# Patient Record
Sex: Female | Born: 1965 | Race: Black or African American | Hispanic: No | Marital: Single | Smoking: Former smoker
Health system: Southern US, Community
[De-identification: ages and names within clinical notes are randomized; demographics above are authoritative.]

## PROBLEM LIST (undated history)

## (undated) DIAGNOSIS — I10 Essential (primary) hypertension: Secondary | ICD-10-CM

## (undated) DIAGNOSIS — E785 Hyperlipidemia, unspecified: Secondary | ICD-10-CM

## (undated) DIAGNOSIS — R569 Unspecified convulsions: Secondary | ICD-10-CM

## (undated) DIAGNOSIS — R1310 Dysphagia, unspecified: Secondary | ICD-10-CM

## (undated) HISTORY — PX: HIP SURGERY: SHX245

## (undated) HISTORY — DX: Dysphagia, unspecified: R13.10

## (undated) HISTORY — PX: NECK SURGERY: SHX720

## (undated) HISTORY — PX: LEEP: SHX91

## (undated) HISTORY — PX: FEMORAL OSTEOTOMY W/ RODDING: SHX1588

## (undated) HISTORY — PX: CHOLECYSTECTOMY: SHX55

## (undated) HISTORY — PX: BREAST BIOPSY: SHX20

---

## 2003-12-19 ENCOUNTER — Other Ambulatory Visit: Payer: Self-pay

## 2003-12-20 ENCOUNTER — Other Ambulatory Visit: Payer: Self-pay

## 2003-12-20 ENCOUNTER — Inpatient Hospital Stay: Payer: Self-pay | Admitting: Internal Medicine

## 2004-10-13 ENCOUNTER — Other Ambulatory Visit: Payer: Self-pay

## 2004-10-14 ENCOUNTER — Ambulatory Visit: Payer: Self-pay | Admitting: Surgery

## 2005-05-28 ENCOUNTER — Emergency Department: Payer: Self-pay | Admitting: Emergency Medicine

## 2005-08-25 ENCOUNTER — Emergency Department: Payer: Self-pay | Admitting: Unknown Physician Specialty

## 2005-09-06 ENCOUNTER — Ambulatory Visit: Payer: Self-pay

## 2006-01-26 ENCOUNTER — Ambulatory Visit: Payer: Self-pay | Admitting: Family Medicine

## 2006-01-28 ENCOUNTER — Ambulatory Visit: Payer: Self-pay | Admitting: Obstetrics and Gynecology

## 2009-03-20 ENCOUNTER — Emergency Department: Payer: Self-pay | Admitting: Unknown Physician Specialty

## 2009-05-15 ENCOUNTER — Emergency Department: Payer: Self-pay | Admitting: Emergency Medicine

## 2009-06-24 ENCOUNTER — Emergency Department: Payer: Self-pay | Admitting: Emergency Medicine

## 2009-07-11 ENCOUNTER — Emergency Department: Payer: Self-pay | Admitting: Emergency Medicine

## 2009-08-11 ENCOUNTER — Emergency Department: Payer: Self-pay | Admitting: Emergency Medicine

## 2009-10-08 ENCOUNTER — Emergency Department: Payer: Self-pay | Admitting: Emergency Medicine

## 2009-10-31 ENCOUNTER — Emergency Department: Payer: Self-pay | Admitting: Emergency Medicine

## 2009-11-22 ENCOUNTER — Emergency Department: Payer: Self-pay | Admitting: Emergency Medicine

## 2009-12-14 ENCOUNTER — Emergency Department: Payer: Self-pay | Admitting: Emergency Medicine

## 2009-12-18 ENCOUNTER — Emergency Department: Payer: Self-pay | Admitting: Emergency Medicine

## 2009-12-28 ENCOUNTER — Emergency Department: Payer: Self-pay | Admitting: Emergency Medicine

## 2010-01-26 ENCOUNTER — Emergency Department: Payer: Self-pay | Admitting: Emergency Medicine

## 2010-02-10 ENCOUNTER — Emergency Department: Payer: Self-pay | Admitting: Emergency Medicine

## 2010-02-23 ENCOUNTER — Emergency Department: Payer: Self-pay | Admitting: Emergency Medicine

## 2010-03-24 ENCOUNTER — Emergency Department: Payer: Self-pay | Admitting: Emergency Medicine

## 2010-03-29 ENCOUNTER — Emergency Department: Payer: Self-pay | Admitting: Emergency Medicine

## 2010-07-21 ENCOUNTER — Emergency Department: Payer: Self-pay | Admitting: Emergency Medicine

## 2010-08-06 ENCOUNTER — Emergency Department: Payer: Self-pay | Admitting: Emergency Medicine

## 2010-12-17 ENCOUNTER — Emergency Department: Payer: Self-pay | Admitting: Emergency Medicine

## 2011-01-15 ENCOUNTER — Emergency Department: Payer: Self-pay | Admitting: Emergency Medicine

## 2011-02-22 ENCOUNTER — Emergency Department: Payer: Self-pay | Admitting: Emergency Medicine

## 2011-04-10 ENCOUNTER — Emergency Department: Payer: Self-pay | Admitting: *Deleted

## 2011-04-10 LAB — COMPREHENSIVE METABOLIC PANEL
Albumin: 3.4 g/dL (ref 3.4–5.0)
BUN: 8 mg/dL (ref 7–18)
Calcium, Total: 7.9 mg/dL — ABNORMAL LOW (ref 8.5–10.1)
Chloride: 107 mmol/L (ref 98–107)
EGFR (African American): >60
Glucose: 102 mg/dL — ABNORMAL HIGH (ref 65–99)
SGOT(AST): 24 U/L (ref 15–37)
SGPT (ALT): 25 U/L
Total Protein: 6.9 g/dL (ref 6.4–8.2)

## 2011-04-10 LAB — CBC WITH DIFFERENTIAL/PLATELET
Basophil #: 0 10*3/uL (ref 0.0–0.1)
Basophil %: 0.3 %
Eosinophil #: 0.2 10*3/uL (ref 0.0–0.7)
HCT: 35.8 % (ref 35.0–47.0)
HGB: 11.9 g/dL — ABNORMAL LOW (ref 12.0–16.0)
Lymphocyte %: 22.3 %
MCH: 32.4 pg (ref 26.0–34.0)
MCHC: 33.4 g/dL (ref 32.0–36.0)
MCV: 97 fL (ref 80–100)
Monocyte #: 0.7 10*3/uL (ref 0.0–0.7)
Monocyte %: 12.3 %
Neutrophil #: 3.8 10*3/uL (ref 1.4–6.5)

## 2011-04-10 LAB — PHENYTOIN LEVEL, TOTAL: Dilantin: 4.8 ug/mL — ABNORMAL LOW (ref 10.0–20.0)

## 2011-04-15 ENCOUNTER — Emergency Department: Payer: Self-pay | Admitting: Emergency Medicine

## 2011-04-15 LAB — COMPREHENSIVE METABOLIC PANEL
Alkaline Phosphatase: 73 U/L (ref 50–136)
BUN: 6 mg/dL — ABNORMAL LOW (ref 7–18)
Co2: 22 mmol/L (ref 21–32)
Creatinine: 0.64 mg/dL (ref 0.60–1.30)
EGFR (Non-African Amer.): >60
Potassium: 3.6 mmol/L (ref 3.5–5.1)
SGOT(AST): 43 U/L — ABNORMAL HIGH (ref 15–37)

## 2011-04-15 LAB — ETHANOL: Ethanol %: <0.003 % (ref 0.000–0.080)

## 2011-04-15 LAB — CBC
MCV: 96 fL (ref 80–100)
Platelet: 179 10*3/uL (ref 150–440)
RDW: 13.4 % (ref 11.5–14.5)
WBC: 7.8 10*3/uL (ref 3.6–11.0)

## 2011-05-31 ENCOUNTER — Emergency Department: Payer: Self-pay | Admitting: Emergency Medicine

## 2011-05-31 LAB — URINALYSIS, COMPLETE
Bacteria: NONE SEEN
Bilirubin,UR: NEGATIVE
Blood: NEGATIVE
Glucose,UR: NEGATIVE mg/dL (ref 0–75)
Leukocyte Esterase: NEGATIVE
Protein: NEGATIVE
RBC,UR: NONE SEEN /HPF (ref 0–5)
WBC UR: <1 /HPF (ref 0–5)

## 2011-05-31 LAB — BASIC METABOLIC PANEL
BUN: 7 mg/dL (ref 7–18)
Calcium, Total: 8.2 mg/dL — ABNORMAL LOW (ref 8.5–10.1)
Co2: 29 mmol/L (ref 21–32)
EGFR (African American): >60
EGFR (Non-African Amer.): >60
Glucose: 98 mg/dL (ref 65–99)
Osmolality: 277 (ref 275–301)
Sodium: 140 mmol/L (ref 136–145)

## 2011-05-31 LAB — DRUG SCREEN, URINE
Benzodiazepine, Ur Scrn: NEGATIVE (ref ?–200)
Cocaine Metabolite,Ur ~~LOC~~: NEGATIVE (ref ?–300)
MDMA (Ecstasy)Ur Screen: NEGATIVE (ref ?–500)
Methadone, Ur Screen: NEGATIVE (ref ?–300)
Phencyclidine (PCP) Ur S: NEGATIVE (ref ?–25)
Tricyclic, Ur Screen: NEGATIVE (ref ?–1000)

## 2011-05-31 LAB — CBC
HCT: 36.9 % (ref 35.0–47.0)
MCH: 32.8 pg (ref 26.0–34.0)
Platelet: 171 10*3/uL (ref 150–440)
WBC: 8.6 10*3/uL (ref 3.6–11.0)

## 2011-05-31 LAB — ETHANOL: Ethanol: <3 mg/dL

## 2011-06-17 ENCOUNTER — Emergency Department: Payer: Self-pay | Admitting: Emergency Medicine

## 2011-06-17 LAB — PHENYTOIN LEVEL, TOTAL: Dilantin: 4.5 ug/mL — ABNORMAL LOW (ref 10.0–20.0)

## 2011-06-17 LAB — BASIC METABOLIC PANEL
Anion Gap: 7 (ref 7–16)
BUN: 10 mg/dL (ref 7–18)
Calcium, Total: 8.1 mg/dL — ABNORMAL LOW (ref 8.5–10.1)
Chloride: 105 mmol/L (ref 98–107)
Co2: 29 mmol/L (ref 21–32)
Creatinine: 0.62 mg/dL (ref 0.60–1.30)
EGFR (African American): >60
EGFR (Non-African Amer.): >60
Osmolality: 282 (ref 275–301)
Potassium: 3.8 mmol/L (ref 3.5–5.1)

## 2011-06-17 LAB — CBC
HCT: 35.7 % (ref 35.0–47.0)
HGB: 12 g/dL (ref 12.0–16.0)
MCH: 32.3 pg (ref 26.0–34.0)
MCV: 96 fL (ref 80–100)
RBC: 3.72 10*6/uL — ABNORMAL LOW (ref 3.80–5.20)

## 2011-06-17 LAB — ETHANOL: Ethanol %: <0.003 % (ref 0.000–0.080)

## 2011-07-11 ENCOUNTER — Emergency Department: Payer: Self-pay | Admitting: Internal Medicine

## 2011-07-11 ENCOUNTER — Emergency Department: Payer: Self-pay | Admitting: Emergency Medicine

## 2011-07-11 LAB — URINALYSIS, COMPLETE
Blood: NEGATIVE
Ketone: NEGATIVE
Nitrite: NEGATIVE
Ph: 6 (ref 4.5–8.0)
Protein: NEGATIVE
Specific Gravity: 1.005 (ref 1.003–1.030)
WBC UR: 1 /HPF (ref 0–5)

## 2011-07-11 LAB — CBC
HCT: 37.4 % (ref 35.0–47.0)
MCV: 96 fL (ref 80–100)
Platelet: 183 10*3/uL (ref 150–440)
RBC: 3.88 10*6/uL (ref 3.80–5.20)
RDW: 13.6 % (ref 11.5–14.5)
WBC: 7.9 10*3/uL (ref 3.6–11.0)

## 2011-07-11 LAB — PHENYTOIN LEVEL, TOTAL: Dilantin: 17.2 ug/mL (ref 10.0–20.0)

## 2011-07-11 LAB — COMPREHENSIVE METABOLIC PANEL
Albumin: 3.5 g/dL (ref 3.4–5.0)
Anion Gap: 6 — ABNORMAL LOW (ref 7–16)
BUN: 10 mg/dL (ref 7–18)
Bilirubin,Total: 0.2 mg/dL (ref 0.2–1.0)
Calcium, Total: 8.3 mg/dL — ABNORMAL LOW (ref 8.5–10.1)
Co2: 28 mmol/L (ref 21–32)
Creatinine: 0.84 mg/dL (ref 0.60–1.30)
EGFR (African American): >60
EGFR (Non-African Amer.): >60
Osmolality: 280 (ref 275–301)
Potassium: 3.5 mmol/L (ref 3.5–5.1)
SGOT(AST): 39 U/L — ABNORMAL HIGH (ref 15–37)
Sodium: 140 mmol/L (ref 136–145)
Total Protein: 7.3 g/dL (ref 6.4–8.2)

## 2011-07-11 LAB — DRUG SCREEN, URINE
Amphetamines, Ur Screen: NEGATIVE (ref ?–1000)
Barbiturates, Ur Screen: NEGATIVE (ref ?–200)
Cannabinoid 50 Ng, Ur ~~LOC~~: NEGATIVE (ref ?–50)
Cocaine Metabolite,Ur ~~LOC~~: NEGATIVE (ref ?–300)
Phencyclidine (PCP) Ur S: NEGATIVE (ref ?–25)

## 2011-07-11 LAB — ETHANOL
Ethanol %: <0.003 % (ref 0.000–0.080)
Ethanol: <3 mg/dL

## 2011-08-08 ENCOUNTER — Emergency Department: Payer: Self-pay | Admitting: Emergency Medicine

## 2011-08-08 LAB — BASIC METABOLIC PANEL
Anion Gap: 9 (ref 7–16)
BUN: 11 mg/dL (ref 7–18)
Calcium, Total: 8.2 mg/dL — ABNORMAL LOW (ref 8.5–10.1)
Chloride: 102 mmol/L (ref 98–107)
Co2: 30 mmol/L (ref 21–32)
EGFR (Non-African Amer.): >60
Glucose: 99 mg/dL (ref 65–99)
Osmolality: 281 (ref 275–301)
Potassium: 2.9 mmol/L — ABNORMAL LOW (ref 3.5–5.1)
Sodium: 141 mmol/L (ref 136–145)

## 2011-08-08 LAB — CBC
MCH: 32.1 pg (ref 26.0–34.0)
MCHC: 33.3 g/dL (ref 32.0–36.0)
MCV: 96 fL (ref 80–100)
Platelet: 188 10*3/uL (ref 150–440)
RBC: 3.88 10*6/uL (ref 3.80–5.20)
RDW: 12.9 % (ref 11.5–14.5)

## 2011-08-08 LAB — ETHANOL
Ethanol %: <0.003 % (ref 0.000–0.080)
Ethanol: <3 mg/dL

## 2011-08-08 LAB — PHENYTOIN LEVEL, TOTAL: Dilantin: 16.2 ug/mL (ref 10.0–20.0)

## 2011-08-29 ENCOUNTER — Emergency Department: Payer: Self-pay | Admitting: Emergency Medicine

## 2011-09-24 ENCOUNTER — Emergency Department: Payer: Self-pay | Admitting: *Deleted

## 2011-12-19 ENCOUNTER — Emergency Department: Payer: Self-pay | Admitting: Unknown Physician Specialty

## 2011-12-19 LAB — URINALYSIS, COMPLETE
Blood: NEGATIVE
Ketone: NEGATIVE
Leukocyte Esterase: NEGATIVE
Nitrite: NEGATIVE
Ph: 6 (ref 4.5–8.0)
RBC,UR: 1 /HPF (ref 0–5)
Specific Gravity: 1.008 (ref 1.003–1.030)
Squamous Epithelial: NONE SEEN

## 2011-12-19 LAB — CBC WITH DIFFERENTIAL/PLATELET
Basophil #: 0 10*3/uL (ref 0.0–0.1)
Eosinophil #: 0.3 10*3/uL (ref 0.0–0.7)
Lymphocyte #: 2.1 10*3/uL (ref 1.0–3.6)
Lymphocyte %: 21.1 %
MCHC: 34.3 g/dL (ref 32.0–36.0)
MCV: 95 fL (ref 80–100)
Monocyte %: 8.7 %
Neutrophil %: 67 %
Platelet: 184 10*3/uL (ref 150–440)
RBC: 3.99 10*6/uL (ref 3.80–5.20)
RDW: 12.8 % (ref 11.5–14.5)
WBC: 10.1 10*3/uL (ref 3.6–11.0)

## 2011-12-19 LAB — BASIC METABOLIC PANEL
Anion Gap: 9 (ref 7–16)
Calcium, Total: 7.9 mg/dL — ABNORMAL LOW (ref 8.5–10.1)
Chloride: 108 mmol/L — ABNORMAL HIGH (ref 98–107)
Co2: 23 mmol/L (ref 21–32)
EGFR (Non-African Amer.): >60
Glucose: 102 mg/dL — ABNORMAL HIGH (ref 65–99)
Osmolality: 280 (ref 275–301)
Sodium: 140 mmol/L (ref 136–145)

## 2011-12-19 LAB — PHENYTOIN LEVEL, TOTAL: Dilantin: 3.8 ug/mL — ABNORMAL LOW (ref 10.0–20.0)

## 2012-02-04 IMAGING — CR DG CHEST 1V PORT
1 series · 1 of 1 positions shown · non-contrast
Comparison: none

REASON FOR EXAM: seizure
COMMENTS:

PROCEDURE:     DXR - DXR PORTABLE CHEST SINGLE VIEW  - July 11, 2009  [DATE]
RESULT:     The lung fields are clear. The heart size is within normal
limits. There is no interstitial or pulmonary edema. The mediastinal and
osseous structures are normal in appearance.

[view not recorded]
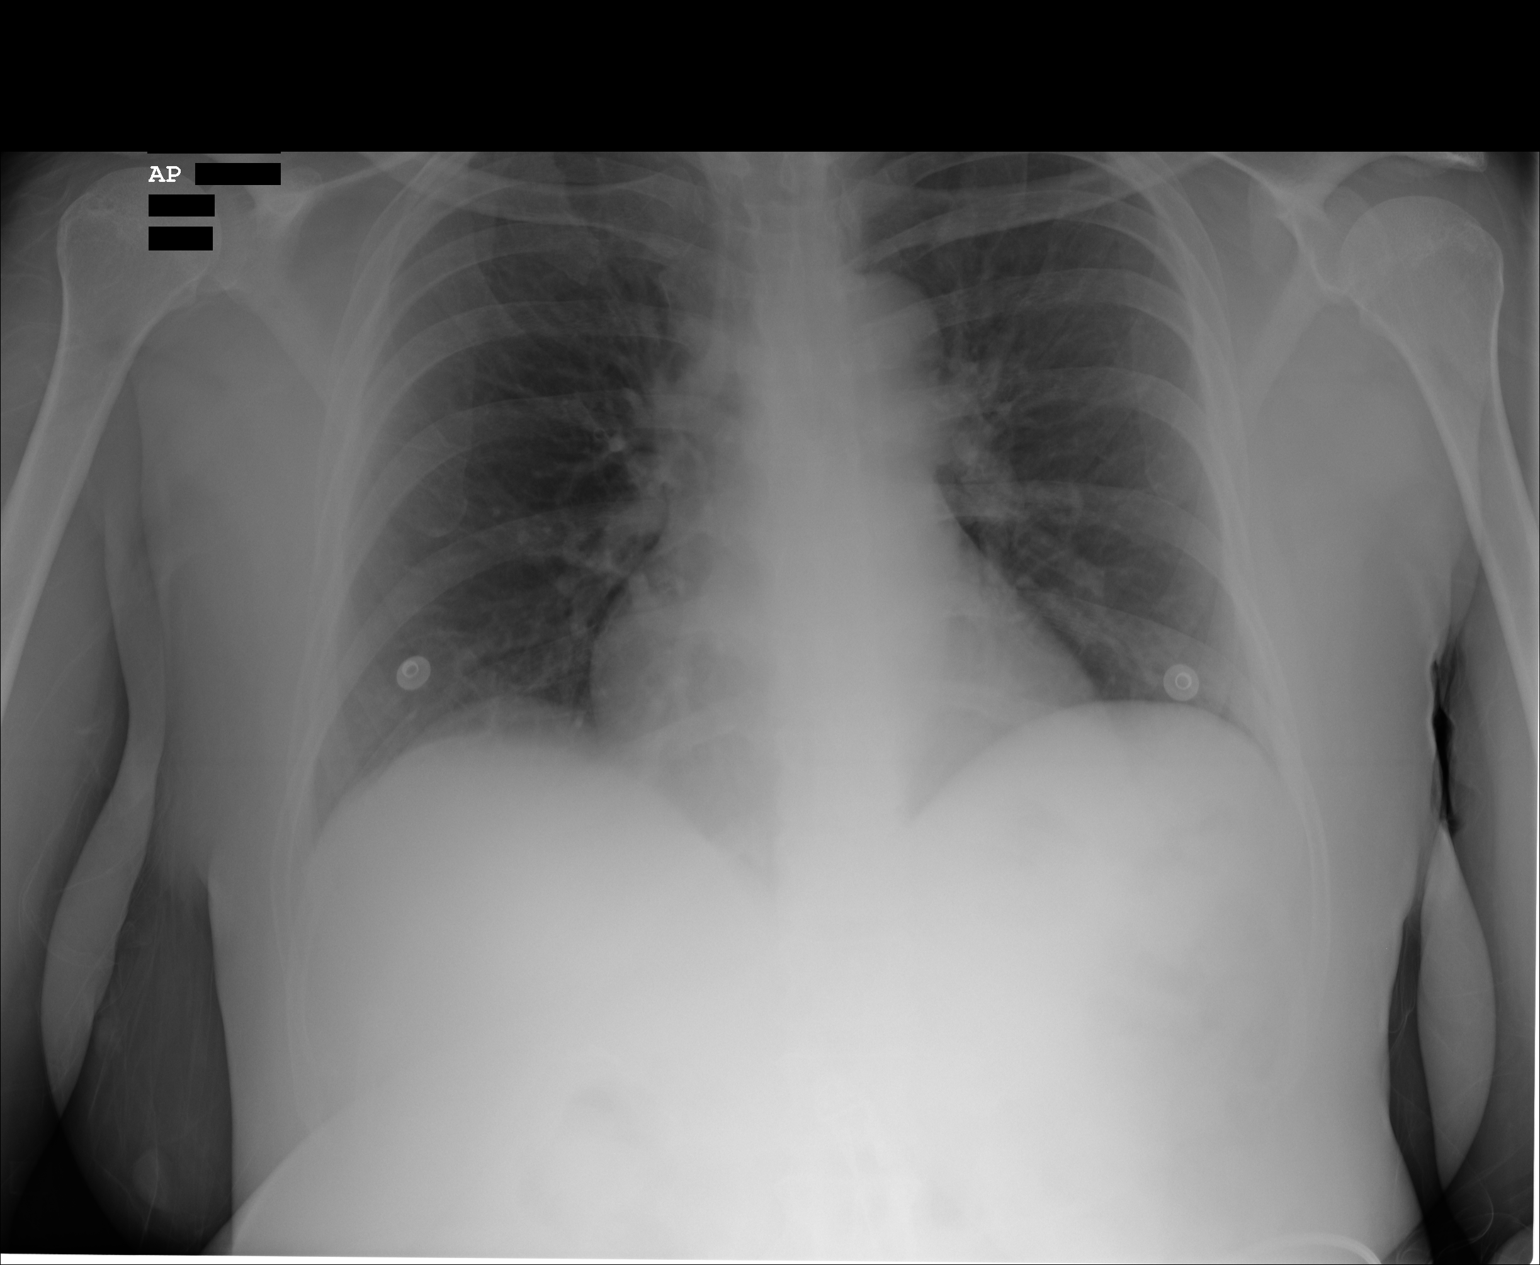

[1 of 1 positions shown; findings below may reference images not displayed]

IMPRESSION: No significant abnormalities are noted.

## 2012-11-23 ENCOUNTER — Emergency Department: Payer: Self-pay | Admitting: Emergency Medicine

## 2013-01-17 LAB — CBC
HCT: 33.7 % — ABNORMAL LOW (ref 35.0–47.0)
HGB: 11.4 g/dL — ABNORMAL LOW (ref 12.0–16.0)
MCH: 29.7 pg (ref 26.0–34.0)
MCHC: 34 g/dL (ref 32.0–36.0)
MCV: 88 fL (ref 80–100)
RDW: 13.4 % (ref 11.5–14.5)
WBC: 11 10*3/uL (ref 3.6–11.0)

## 2013-01-17 LAB — COMPREHENSIVE METABOLIC PANEL
Anion Gap: 8 (ref 7–16)
BUN: 3 mg/dL — ABNORMAL LOW (ref 7–18)
Chloride: 102 mmol/L (ref 98–107)
EGFR (African American): >60
Osmolality: 284 (ref 275–301)
SGOT(AST): 216 U/L — ABNORMAL HIGH (ref 15–37)
SGPT (ALT): 59 U/L (ref 12–78)
Sodium: 144 mmol/L (ref 136–145)
Total Protein: 7.2 g/dL (ref 6.4–8.2)

## 2013-01-17 LAB — PHENYTOIN LEVEL, TOTAL: Dilantin: 6.6 ug/mL — ABNORMAL LOW (ref 10.0–20.0)

## 2013-01-18 ENCOUNTER — Inpatient Hospital Stay: Payer: Self-pay | Admitting: Internal Medicine

## 2013-01-18 LAB — BASIC METABOLIC PANEL
Anion Gap: 7 (ref 7–16)
BUN: 4 mg/dL — ABNORMAL LOW (ref 7–18)
Calcium, Total: 5.8 mg/dL — CL (ref 8.5–10.1)
Chloride: 104 mmol/L (ref 98–107)
Co2: 32 mmol/L (ref 21–32)
Creatinine: 0.93 mg/dL (ref 0.60–1.30)
EGFR (African American): >60
EGFR (Non-African Amer.): >60
Glucose: 106 mg/dL — ABNORMAL HIGH (ref 65–99)
Osmolality: 282 (ref 275–301)
Potassium: 2.4 mmol/L — CL (ref 3.5–5.1)
Sodium: 143 mmol/L (ref 136–145)

## 2013-01-18 LAB — TSH
Thyroid Stimulating Horm: 0.203 u[IU]/mL — ABNORMAL LOW
Thyroid Stimulating Horm: 0.285 u[IU]/mL — ABNORMAL LOW

## 2013-01-18 LAB — MAGNESIUM: Magnesium: 1.4 mg/dL — ABNORMAL LOW

## 2013-01-18 LAB — T4, FREE: Free Thyroxine: 0.94 ng/dL (ref 0.76–1.46)

## 2013-01-19 LAB — BASIC METABOLIC PANEL
Anion Gap: 4 — ABNORMAL LOW (ref 7–16)
BUN: 5 mg/dL — AB (ref 7–18)
CREATININE: 0.83 mg/dL (ref 0.60–1.30)
Calcium, Total: 6.4 mg/dL — CL (ref 8.5–10.1)
Chloride: 101 mmol/L (ref 98–107)
Co2: 32 mmol/L (ref 21–32)
EGFR (Non-African Amer.): >60
Glucose: 108 mg/dL — ABNORMAL HIGH (ref 65–99)
OSMOLALITY: 272 (ref 275–301)
Potassium: 2.7 mmol/L — ABNORMAL LOW (ref 3.5–5.1)
SODIUM: 137 mmol/L (ref 136–145)

## 2013-01-19 LAB — MAGNESIUM: Magnesium: 0.9 mg/dL — ABNORMAL LOW

## 2013-01-20 LAB — BASIC METABOLIC PANEL
Anion Gap: 3 — ABNORMAL LOW (ref 7–16)
BUN: 7 mg/dL (ref 7–18)
CALCIUM: 7.6 mg/dL — AB (ref 8.5–10.1)
CHLORIDE: 100 mmol/L (ref 98–107)
Co2: 32 mmol/L (ref 21–32)
Creatinine: 0.73 mg/dL (ref 0.60–1.30)
EGFR (African American): >60
EGFR (Non-African Amer.): >60
Glucose: 104 mg/dL — ABNORMAL HIGH (ref 65–99)
Osmolality: 268 (ref 275–301)
POTASSIUM: 3.2 mmol/L — AB (ref 3.5–5.1)
Sodium: 135 mmol/L — ABNORMAL LOW (ref 136–145)

## 2013-01-20 LAB — PHOSPHORUS: Phosphorus: 2.8 mg/dL (ref 2.5–4.9)

## 2013-01-20 LAB — MAGNESIUM: Magnesium: 1.3 mg/dL — ABNORMAL LOW

## 2013-01-21 LAB — BASIC METABOLIC PANEL
ANION GAP: 1 — AB (ref 7–16)
BUN: 8 mg/dL (ref 7–18)
Calcium, Total: 7.8 mg/dL — ABNORMAL LOW (ref 8.5–10.1)
Chloride: 101 mmol/L (ref 98–107)
Co2: 32 mmol/L (ref 21–32)
Creatinine: 0.67 mg/dL (ref 0.60–1.30)
EGFR (African American): >60
Glucose: 94 mg/dL (ref 65–99)
Osmolality: 266 (ref 275–301)
POTASSIUM: 4.2 mmol/L (ref 3.5–5.1)
Sodium: 134 mmol/L — ABNORMAL LOW (ref 136–145)

## 2013-01-21 LAB — PLATELET COUNT: Platelet: 220 10*3/uL (ref 150–440)

## 2013-01-21 LAB — MAGNESIUM: MAGNESIUM: 1.5 mg/dL — AB

## 2013-12-20 ENCOUNTER — Emergency Department: Payer: Self-pay | Admitting: Emergency Medicine

## 2013-12-20 LAB — CBC WITH DIFFERENTIAL/PLATELET
Basophil #: 0 10*3/uL (ref 0.0–0.1)
Basophil %: 0.5 %
EOS PCT: 4 %
Eosinophil #: 0.4 10*3/uL (ref 0.0–0.7)
HCT: 35.6 % (ref 35.0–47.0)
HGB: 11.6 g/dL — ABNORMAL LOW (ref 12.0–16.0)
Lymphocyte #: 1.7 10*3/uL (ref 1.0–3.6)
Lymphocyte %: 17.5 %
MCH: 31.4 pg (ref 26.0–34.0)
MCHC: 32.5 g/dL (ref 32.0–36.0)
MCV: 97 fL (ref 80–100)
Monocyte #: 0.6 x10 3/mm (ref 0.2–0.9)
Monocyte %: 6.4 %
Neutrophil #: 7.1 10*3/uL — ABNORMAL HIGH (ref 1.4–6.5)
Neutrophil %: 71.6 %
Platelet: 202 10*3/uL (ref 150–440)
RBC: 3.68 10*6/uL — ABNORMAL LOW (ref 3.80–5.20)
RDW: 14.2 % (ref 11.5–14.5)
WBC: 9.9 10*3/uL (ref 3.6–11.0)

## 2013-12-20 LAB — BASIC METABOLIC PANEL
Anion Gap: 10 (ref 7–16)
BUN: 6 mg/dL — ABNORMAL LOW (ref 7–18)
CHLORIDE: 109 mmol/L — AB (ref 98–107)
CREATININE: 0.83 mg/dL (ref 0.60–1.30)
Calcium, Total: 6.8 mg/dL — CL (ref 8.5–10.1)
Co2: 25 mmol/L (ref 21–32)
EGFR (African American): >60
Glucose: 95 mg/dL (ref 65–99)
OSMOLALITY: 284 (ref 275–301)
POTASSIUM: 2.5 mmol/L — AB (ref 3.5–5.1)
Sodium: 144 mmol/L (ref 136–145)

## 2013-12-20 LAB — PHENYTOIN LEVEL, TOTAL: Dilantin: 5 ug/mL — ABNORMAL LOW (ref 10.0–20.0)

## 2014-02-03 IMAGING — CT CT HEAD WITHOUT CONTRAST
2 series · 16 of 30 positions shown, 20 images · non-contrast
Comparison: none

REASON FOR EXAM: ha seizure
COMMENTS:

[Series 2: without · axial · non-contrast · 0.41mm/px · z∈[-156,-31]mm · 13 of 31 slices shown, 17 images]
[im 3/31  brain]
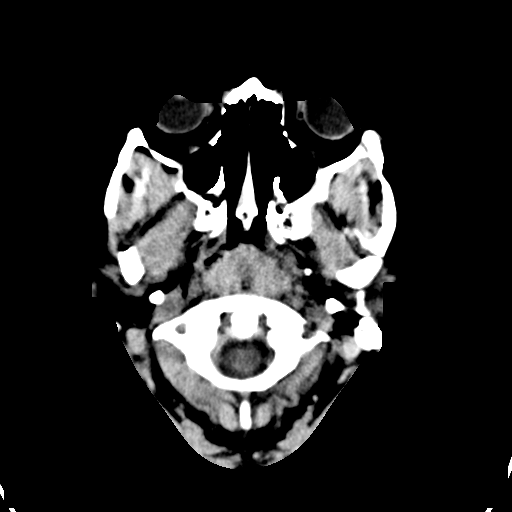
[im 3/31  bone]
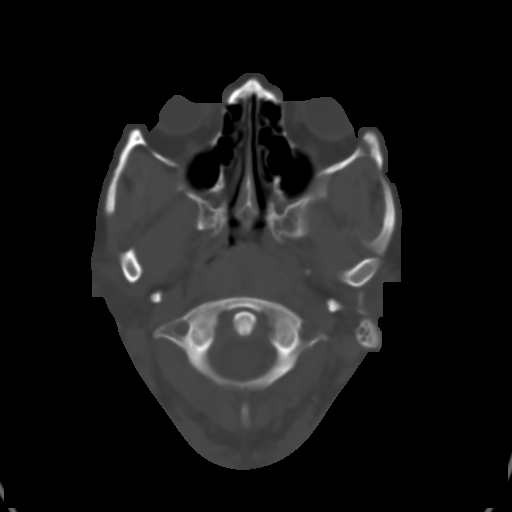
[im 5/31  brain]
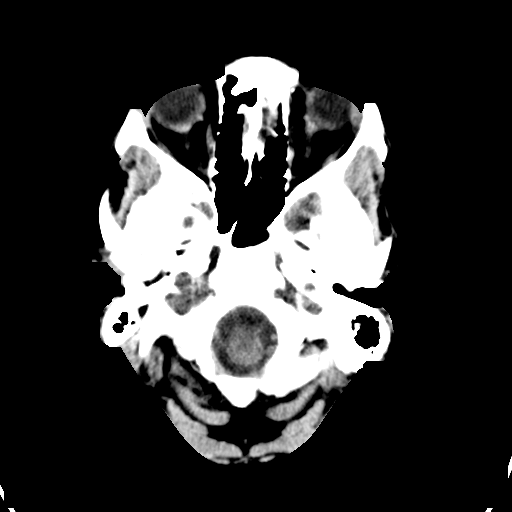
[im 7/31  brain]
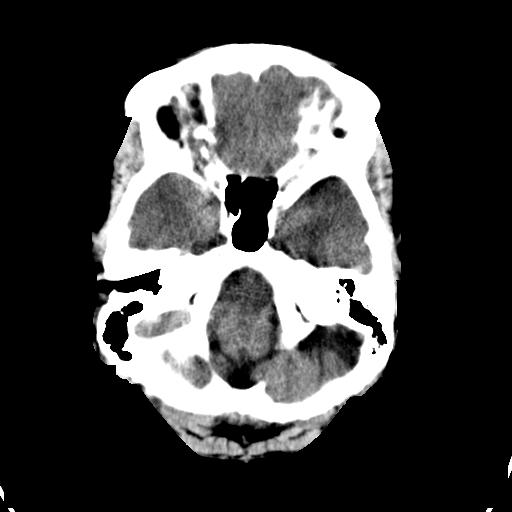
[im 9/31  brain]
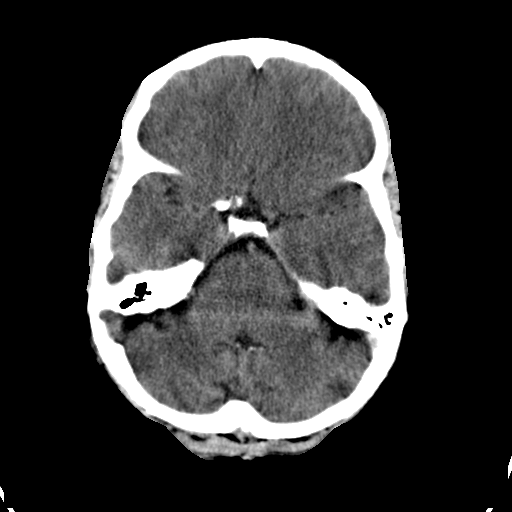
[im 11/31  brain]
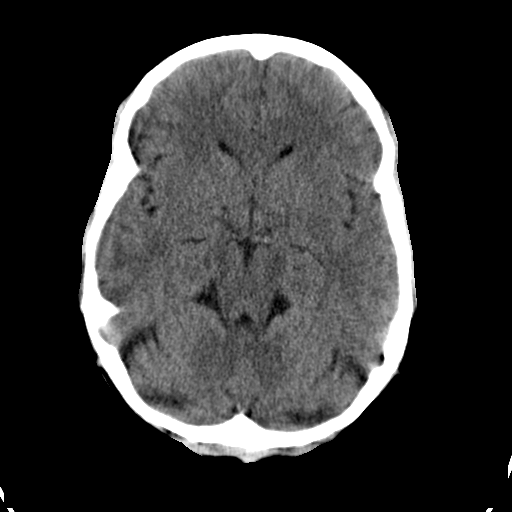
[im 11/31  bone]
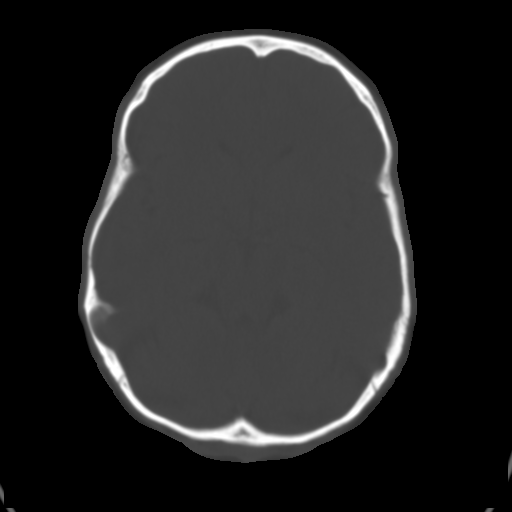
[im 13/31  brain]
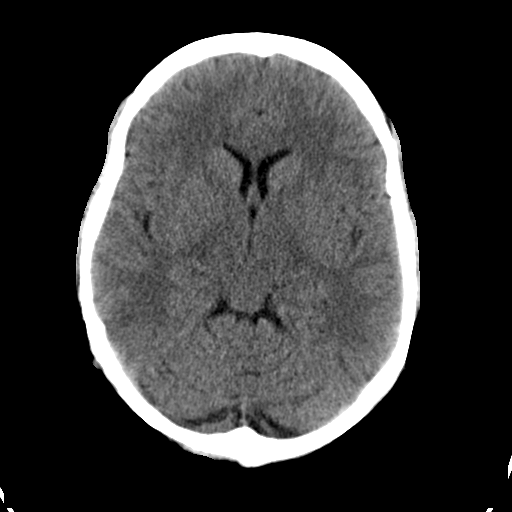
[im 16/31  brain]
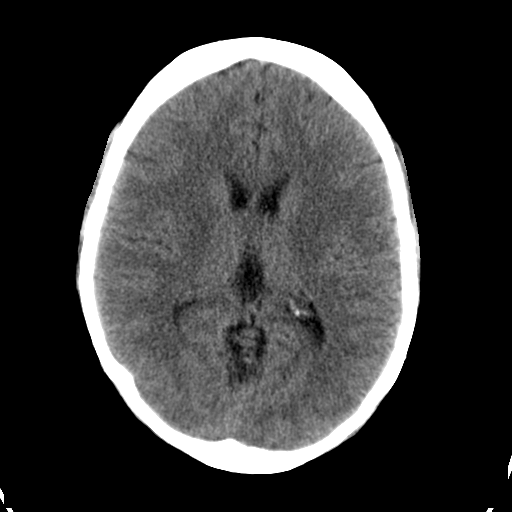
[im 18/31  brain]
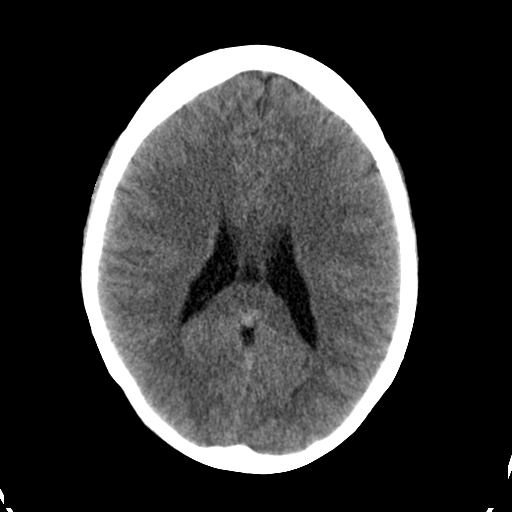
[im 20/31  brain]
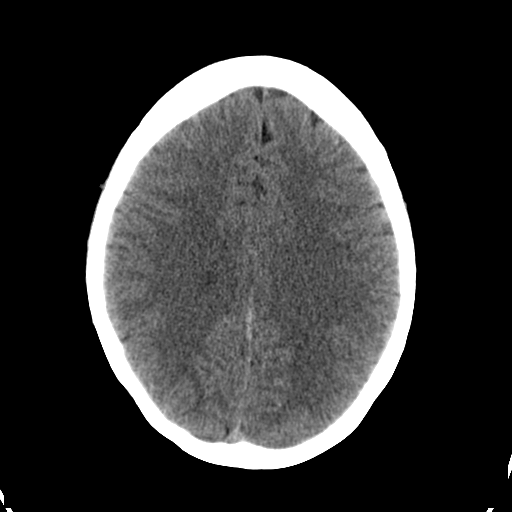
[im 20/31  bone]
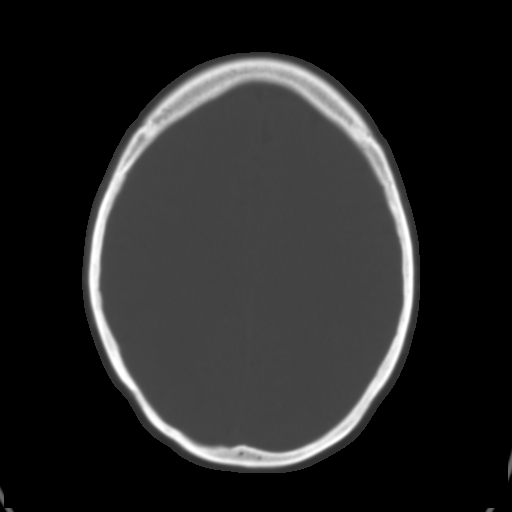
[im 22/31  brain]
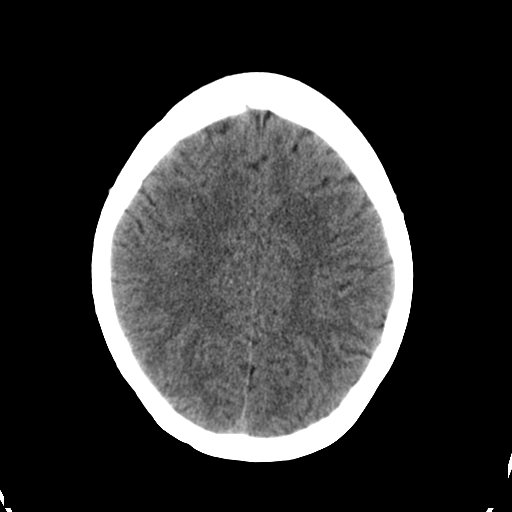
[im 24/31  brain]
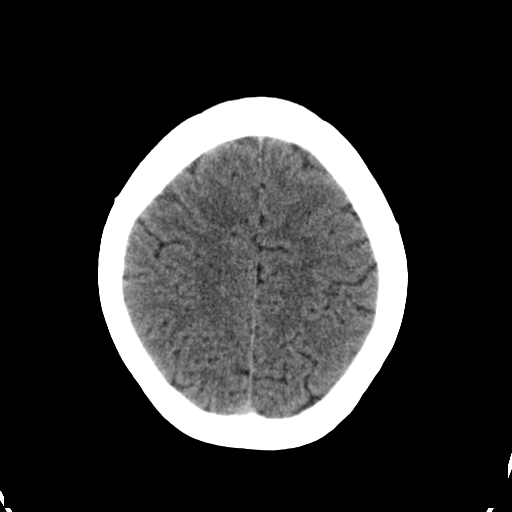
[im 26/31  brain]
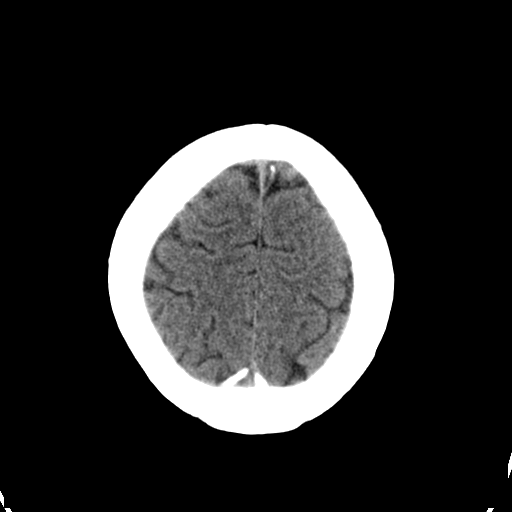
[im 28/31  brain]
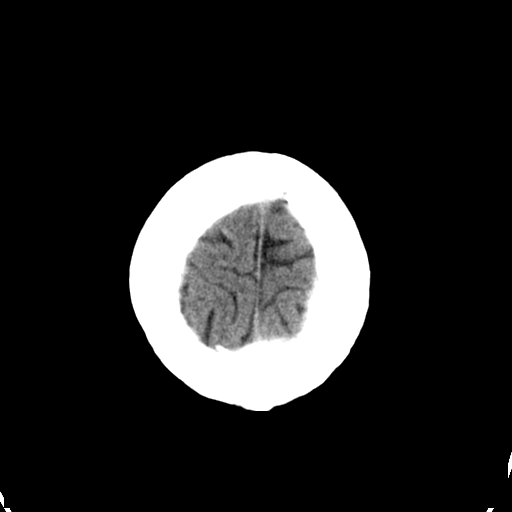
[im 28/31  bone]
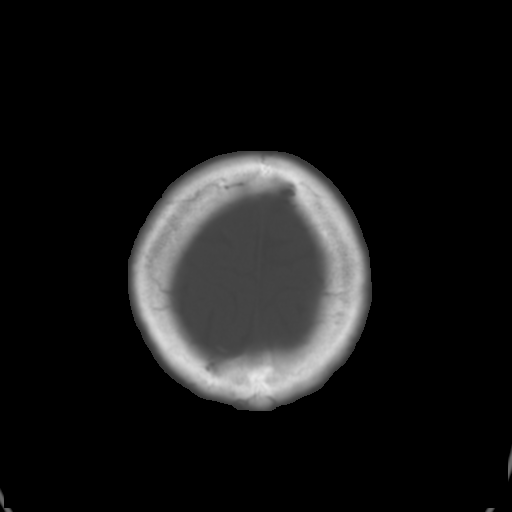

[Series 3: bone · axial · 0.41mm/px · z∈[-156,-116]mm · 3 of 31 slices shown]
[im 3/31  bone]
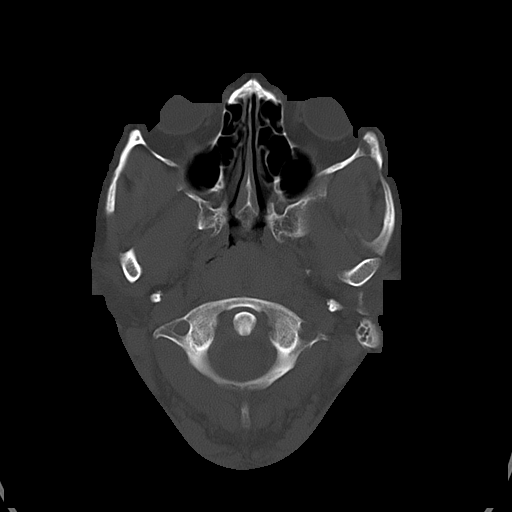
[im 7/31  bone]
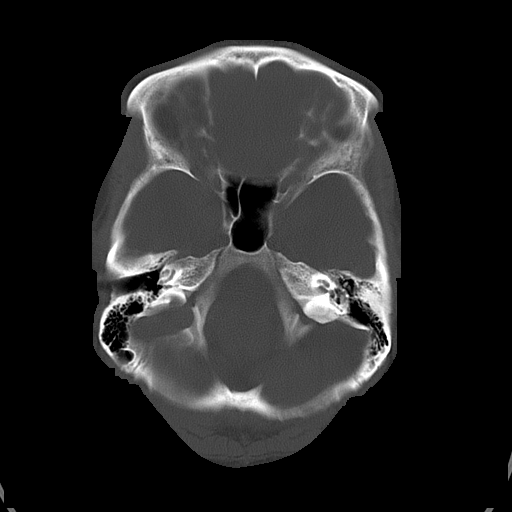
[im 11/31  bone]
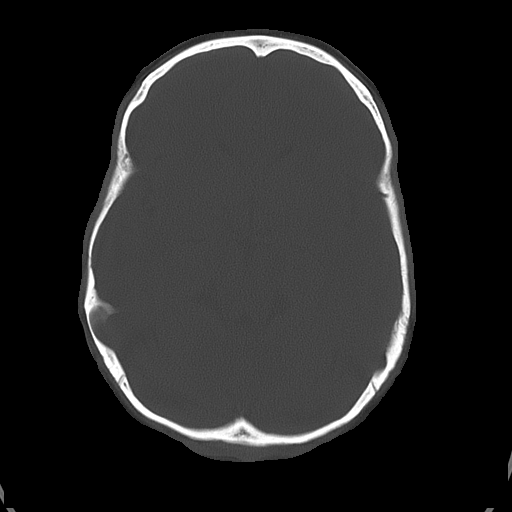

[16 of 30 positions shown; findings below may reference images not displayed]

PROCEDURE:     CT  - CT HEAD WITHOUT CONTRAST  - July 11, 2011 [DATE]

RESULT:     The ventricles and sulci are normal. There is no hemorrhage.
There is no focal mass, mass-effect or midline shift. There is no evidence
of edema or territorial infarct. The bone windows demonstrate normal
aeration of the paranasal sinuses and mastoid air cells. There is no skull
fracture demonstrated. There is no change since the study [DATE]. No acute intracranial abnormality.

[REDACTED]

## 2014-05-11 NOTE — H&P (Signed)
PATIENT NAME:  Sherry Boyd, LAFOREST MR#:  [id number] DATE OF BIRTH:  May 19, 1965  DATE OF ADMISSION:  01/17/2013  REFERRING PHYSICIAN:  Dr. Shaune Pollack.   PRIMARY CARE PHYSICIAN:  [facility].   CHIEF COMPLAINT:  Bilateral hand and foot cramping.   A 49 year old [place] American female with past medical history of hypertension and seizure disorder presenting with hand and foot cramping. She describes a 2 to 3-day duration of intermittent hand and foot cramping and tingling sensation, which is bilateral in nature. No worsening or relieving factors. She also denotes a 2 to 3-week duration of diarrhea with associated decreased p.o. intake, describing this as 3 to 4 bowel movements daily which were watery. She also mentions having recent antibiotic usage approximately 1 month ago; however, denies any current fevers or chills. Currently, she is without complaints.     On routine labs performed by the Emergency Department, she was found to be markedly hypocalcemic, hypokalemic, and hypomagnesemic.   REVIEW OF SYSTEMS:  CONSTITUTIONAL:  Denies fever. However, positive for generalized fatigue and weakness.  EYES:  Denies blurred vision, double vision, eye pain.  EARS, NOSE, THROAT:  Denies tinnitus, ear pain, hearing loss.  RESPIRATORY:  Denies cough, wheeze, shortness of breath.  CARDIOVASCULAR:  Denies chest pain, palpitations, edema.  GASTROINTESTINAL:  Denies nausea, vomiting, diarrhea.  GENITOURINARY:  Denies dysuria, hematuria.  ENDOCRINE:  Denies nocturia or thyroid problems.  HEMATOLOGIC AND LYMPHATIC:  Denies easy bruising or bleeding.  SKIN:  Denies rash or lesions.  MUSCULOSKELETAL:  Denies pain in neck, back, shoulder, knees, hips; or arthritic symptoms.  NEUROLOGICAL:  Positive for paresthesias as described above, bilateral hands and feet. However, denies any paralysis or numbness.  PSYCHIATRIC:  Denies any anxiety or depressive symptoms.  Otherwise, full review of systems performed by me  is negative.   PAST MEDICAL HISTORY:  Hypertension; seizure disorder, last seizure many years ago; anxiety and depression, not otherwise specified.   SOCIAL HISTORY:  Everyday tobacco use, less than 1 pack daily. Denies any alcohol usage or drug usage.   FAMILY HISTORY:  Positive for hypertension and diabetes.   ALLERGIES:  No known drug allergies.   HOME MEDICATIONS:  Include Norvasc 5 mg p.o. daily, magnesium 64 two tablets p.o. b.i.d., phenytoin 100 mg extended release 3 capsules daily at bedtime.   PHYSICAL EXAMINATION:  VITAL SIGNS:  Temperature 98.3, heart rate 76, respirations 18, blood pressure 123/80, saturating 95% on room air. Weight 90.7 kg, BMI 37.8.  GENERAL:  Well-nourished, well-developed, African American female, currently in no acute distress.  HEAD:  Normocephalic, atraumatic.  EYES:  Pupils equal, round, and reactive to light. Extraocular muscles intact. No scleral icterus.  MOUTH:  Dry mucosal membranes. Dentition intact. No abscess noted.  EARS, NOSE, THROAT:  Throat clear without exudates. No external lesions.  NECK:  Supple. No thyromegaly. No nodules. No JVD.  PULMONARY:  Clear to auscultation bilaterally. No wheezes, rales, or rhonchi. No use of accessory muscles. Good respiratory effort.  CHEST:  Nontender to palpation.  CARDIOVASCULAR:  S1, S2. Regular rate and rhythm. No murmurs, rubs, or gallops. No edema. Pedal pulses 2+ bilaterally.  GASTROINTESTINAL:  Soft, nontender, nondistended. No masses. Positive bowel sounds. No hepatosplenomegaly.  MUSCULOSKELETAL:  No swelling, clubbing, or edema. Range of motion full in all extremities.  NEUROLOGICAL:  Cranial nerves II through XII intact. No gross focal neurological deficits. Sensation intact. Reflexes intact.  SKIN:  No ulcerations, lesions, rash, or cyanosis. Skin is warm and dry. Skin turgor  poor.  PSYCHIATRIC:  Mood and affect within normal limits. Awake, alert, and oriented x3. Insight and judgment intact.    LABORATORY DATA:  Sodium 144, potassium 2, chloride 102, bicarb 34, BUN 3, creatinine 0.98, glucose 101, calcium 6.2, magnesium 0.7. LFTs: Albumin 3.1, AST of 216, otherwise within normal limits. Dilantin of 6.6. WBC is 11, hemoglobin 11.4, platelets 238. EKG performed revealing normal sinus rhythm with heart rate of 72.   ASSESSMENT AND PLAN:  A 49 year old [place] female with history of hypertension and seizure disorder presenting with bilateral hand cramping, found to have markedly abnormal labs.  1.  Diarrhea. Given history of recent antibiotic usage, we will check Clostridium difficile, as well as fecal WBCs. If both of these are normal, she can start Imodium for symptomatic relief. Otherwise, she will require treatment for Clostridium difficile if positive.  2.  Hypocalcemia. We will check a vitamin D level, as well as parathyroid hormone level, and replace calcium.  3.  Hypokalemia. Replace potassium with a goal of 4 to 5. This is likely secondary to gastrointestinal losses.  4.  Hypomagnesemia. Replace to goal of 2. Once again, likely related to gastrointestinal losses.  5.  Seizure disorder. Continue with Dilantin.  6.  Venous thromboembolism prophylaxis with heparin subcutaneous.   The patient is FULL CODE.   TIME SPENT: 45 minutes.    ____________________________ Cletis Athensavid K. Omah Dewalt, MD dkh:ms D: 01/17/2013 22:47:02 ET T: 01/17/2013 23:16:29 ET JOB#: [id number]  cc: Cletis Athensavid K. Jevonte Clanton, MD, <Dictator> Tahara Ruffini Synetta ShadowK Neev Mcmains MD ELECTRONICALLY SIGNED 01/18/2013 2:29

## 2014-05-11 NOTE — Discharge Summary (Signed)
PATIENT NAME:  Sherry Boyd, Sherry Boyd MR#:  [id number] DATE OF BIRTH:  1965-11-20  DATE OF ADMISSION:  01/18/2013 DATE OF DISCHARGE:  01/21/2013  PRIMARY CARE PHYSICIAN:  Dr.  [facility].      DISCHARGE DIAGNOSES: 1.  Diarrhea.  2.  Hypokalemia.  3.  Hypomagnesemia.  4.  Hypocalcemia.  5.  Hypertension.  6.  Seizure disorder.   CONDITION: Stable.   CODE STATUS: Full code.   HOME MEDICATIONS:  1.  Phenytoin  100 mg p.o. three caps at bedtime.  2.  Norvasc 5 mg p.o. daily.  3.  Mag-64 oral delayed release tablets 2 tablets twice a day.  4.  Calcium vitamin D 500 mg/200 international units 1 tablet twice a day.   DIET: Low sodium diet.   ACTIVITY: As tolerated.   FOLLOW-UP CARE: Follow with PCP within 1 to 2 weeks.   REASON FOR ADMISSION: Bilateral hand and foot cramping.   HOSPITAL COURSE: The patient is a 49 year old African American female with a history of hypertension, seizure disorder, presented to the ED with a hand and foot cramping for 2 to 3 days. The patient was found to markedly hypocalcemic, hypokalemic and hypomagnesemic.  For detailed history and physical examination, please refer to the admission note dictated by Dr. Clint GuyHower.   Laboratory data on admission date showed sodium 144, potassium 2, chloride 102, bicarbonate 34, BUN 3, creatinine 0.98. Glucose 101, magnesium 0.7, calcium is 6.2, albumin 3.1. Dilantin level 6.6.  1.  Diarrhea after admission, the patient has no bowel movement. We could not get a sample for Clostridium difficile test. In addition, the patient feels constipated. No bowel movement for 3 to 4 days. The patient got milk of magnesia and Colace today and had a bowel movement without  diarrhea.  2.  For hypocalcemia, the patient was placed with calcium and vitamin D tablets.  3.  For hypokalemia. The patient has been treated with potassium both IV and p.o. potassium level increased to 4.2 today.  4.  Hypomagnesemia. The patient continues  hypomagnesemia and after supplement the patient magnesium level increased to 1.5 today. The patient got another dose of IV magnesium and we will continue the patient's oral magnesium medication.  5.  Seizure disorder. No seizure activity,  continue Dilantin. The patient has no complaints today. Vital signs are stable. The patient is clinically stable and will be discharged to home today. I discussed the patient's discharge plan with the patient, nurse.   TIME SPENT: About 35 minutes.   ____________________________ Shaune PollackQing Jouri Threat, MD qc:cc D: 01/21/2013 15:44:59 ET T: 01/21/2013 21:10:57 ET JOB#: [id number]  cc: Shaune PollackQing Evan Mackie, MD, <Dictator> Shaune PollackQING Lauris Serviss MD ELECTRONICALLY SIGNED 01/22/2013 16:54

## 2014-07-01 ENCOUNTER — Emergency Department: Payer: Medicare Other

## 2014-07-01 ENCOUNTER — Encounter: Payer: Self-pay | Admitting: *Deleted

## 2014-07-01 ENCOUNTER — Inpatient Hospital Stay
Admission: EM | Admit: 2014-07-01 | Discharge: 2014-07-03 | DRG: 101 | Disposition: A | Discharge status: Home / Self Care | Payer: Medicare Other | Attending: Internal Medicine | Admitting: Internal Medicine

## 2014-07-01 DIAGNOSIS — Z87891 Personal history of nicotine dependence: Secondary | ICD-10-CM

## 2014-07-01 DIAGNOSIS — Z9049 Acquired absence of other specified parts of digestive tract: Secondary | ICD-10-CM | POA: Diagnosis not present

## 2014-07-01 DIAGNOSIS — E876 Hypokalemia: Secondary | ICD-10-CM | POA: Diagnosis not present

## 2014-07-01 DIAGNOSIS — E785 Hyperlipidemia, unspecified: Secondary | ICD-10-CM | POA: Diagnosis present

## 2014-07-01 DIAGNOSIS — Z79899 Other long term (current) drug therapy: Secondary | ICD-10-CM | POA: Diagnosis not present

## 2014-07-01 DIAGNOSIS — S01511A Laceration without foreign body of lip, initial encounter: Secondary | ICD-10-CM | POA: Diagnosis present

## 2014-07-01 DIAGNOSIS — G319 Degenerative disease of nervous system, unspecified: Secondary | ICD-10-CM | POA: Diagnosis not present

## 2014-07-01 DIAGNOSIS — T50995A Adverse effect of other drugs, medicaments and biological substances, initial encounter: Secondary | ICD-10-CM | POA: Diagnosis present

## 2014-07-01 DIAGNOSIS — I10 Essential (primary) hypertension: Secondary | ICD-10-CM | POA: Diagnosis present

## 2014-07-01 DIAGNOSIS — Z9114 Patient's other noncompliance with medication regimen: Secondary | ICD-10-CM | POA: Diagnosis not present

## 2014-07-01 DIAGNOSIS — R569 Unspecified convulsions: Secondary | ICD-10-CM

## 2014-07-01 DIAGNOSIS — F329 Major depressive disorder, single episode, unspecified: Secondary | ICD-10-CM | POA: Diagnosis not present

## 2014-07-01 DIAGNOSIS — G4089 Other seizures: Principal | ICD-10-CM | POA: Diagnosis present

## 2014-07-01 DIAGNOSIS — IMO0002 Reserved for concepts with insufficient information to code with codable children: Secondary | ICD-10-CM

## 2014-07-01 HISTORY — DX: Unspecified convulsions: R56.9

## 2014-07-01 HISTORY — DX: Essential (primary) hypertension: I10

## 2014-07-01 HISTORY — DX: Hyperlipidemia, unspecified: E78.5

## 2014-07-01 LAB — URINE DRUG SCREEN, QUALITATIVE (ARMC ONLY)
Amphetamines, Ur Screen: NOT DETECTED
BARBITURATES, UR SCREEN: NOT DETECTED
BENZODIAZEPINE, UR SCRN: NOT DETECTED
COCAINE METABOLITE, UR ~~LOC~~: NOT DETECTED
Cannabinoid 50 Ng, Ur ~~LOC~~: NOT DETECTED
MDMA (Ecstasy)Ur Screen: NOT DETECTED
METHADONE SCREEN, URINE: NOT DETECTED
OPIATE, UR SCREEN: NOT DETECTED
Phencyclidine (PCP) Ur S: NOT DETECTED
TRICYCLIC, UR SCREEN: NOT DETECTED

## 2014-07-01 LAB — CBC
HCT: 37.5 % (ref 35.0–47.0)
Hemoglobin: 12.5 g/dL (ref 12.0–16.0)
MCH: 32.2 pg (ref 26.0–34.0)
MCHC: 33.4 g/dL (ref 32.0–36.0)
MCV: 96.3 fL (ref 80.0–100.0)
PLATELETS: 153 10*3/uL (ref 150–440)
RBC: 3.89 MIL/uL (ref 3.80–5.20)
RDW: 13 % (ref 11.5–14.5)
WBC: 10.2 10*3/uL (ref 3.6–11.0)

## 2014-07-01 LAB — BASIC METABOLIC PANEL
Anion gap: 8 (ref 5–15)
BUN: 12 mg/dL (ref 6–20)
CHLORIDE: 105 mmol/L (ref 101–111)
CO2: 25 mmol/L (ref 22–32)
Calcium: 8 mg/dL — ABNORMAL LOW (ref 8.9–10.3)
Creatinine, Ser: 0.89 mg/dL (ref 0.44–1.00)
GFR calc Af Amer: >60 mL/min (ref >60)
GFR calc non Af Amer: >60 mL/min (ref >60)
GLUCOSE: 96 mg/dL (ref 65–99)
Potassium: 2.6 mmol/L — CL (ref 3.5–5.1)
SODIUM: 138 mmol/L (ref 135–145)

## 2014-07-01 LAB — URINALYSIS COMPLETE WITH MICROSCOPIC (ARMC ONLY)
Bacteria, UA: NONE SEEN
Bilirubin Urine: NEGATIVE
Glucose, UA: NEGATIVE mg/dL
Ketones, ur: NEGATIVE mg/dL
Leukocytes, UA: NEGATIVE
Nitrite: NEGATIVE
PROTEIN: 30 mg/dL — AB
Specific Gravity, Urine: 1.006 (ref 1.005–1.030)
pH: 7 (ref 5.0–8.0)

## 2014-07-01 LAB — GLUCOSE, CAPILLARY: GLUCOSE-CAPILLARY: 92 mg/dL (ref 65–99)

## 2014-07-01 LAB — MAGNESIUM: Magnesium: 1.5 mg/dL — ABNORMAL LOW (ref 1.7–2.4)

## 2014-07-01 LAB — POCT PREGNANCY, URINE: PREG TEST UR: NEGATIVE

## 2014-07-01 LAB — PHENYTOIN LEVEL, TOTAL: Phenytoin Lvl: <2.5 ug/mL — ABNORMAL LOW (ref 10.0–20.0)

## 2014-07-01 MED ORDER — FLUOXETINE HCL 10 MG PO CAPS
10.0000 mg | ORAL_CAPSULE | Freq: Every day | ORAL | Status: DC
  Administered 2014-07-02 – 2014-07-03 (×2): 10 mg via ORAL
  Filled 2014-07-01 (×3): qty 1

## 2014-07-01 MED ORDER — POTASSIUM CHLORIDE 10 MEQ/100ML IV SOLN
10.0000 meq | INTRAVENOUS | Status: AC
  Administered 2014-07-01 (×4): 10 meq via INTRAVENOUS
  Filled 2014-07-01 (×7): qty 100

## 2014-07-01 MED ORDER — ALBUTEROL SULFATE (2.5 MG/3ML) 0.083% IN NEBU: 2.5000 mg | INHALATION_SOLUTION | RESPIRATORY_TRACT | Status: DC | PRN

## 2014-07-01 MED ORDER — POTASSIUM CHLORIDE 10 MEQ/100ML IV SOLN
10.0000 meq | INTRAVENOUS | Status: AC
  Administered 2014-07-01 – 2014-07-02 (×4): 10 meq via INTRAVENOUS
  Filled 2014-07-01 (×4): qty 100

## 2014-07-01 MED ORDER — TETANUS-DIPHTHERIA TOXOIDS TD 5-2 LFU IM INJ
0.5000 mL | INJECTION | Freq: Once | INTRAMUSCULAR | Status: DC
Start: 2014-07-01 — End: 2014-07-01

## 2014-07-01 MED ORDER — SODIUM CHLORIDE 0.9 % IV SOLN
Freq: Once | INTRAVENOUS | Status: AC
  Administered 2014-07-01: 15:00:00 via INTRAVENOUS

## 2014-07-01 MED ORDER — LIDOCAINE-EPINEPHRINE (PF) 2 %-1:200000 IJ SOLN
10.0000 mL | Freq: Once | INTRAMUSCULAR | Status: AC
  Administered 2014-07-01: 10 mL via INTRADERMAL

## 2014-07-01 MED ORDER — AMLODIPINE BESYLATE 5 MG PO TABS
5.0000 mg | ORAL_TABLET | Freq: Every day | ORAL | Status: DC
  Administered 2014-07-02 – 2014-07-03 (×2): 5 mg via ORAL
  Filled 2014-07-01 (×3): qty 1

## 2014-07-01 MED ORDER — POTASSIUM CHLORIDE CRYS ER 10 MEQ PO TBCR
10.0000 meq | EXTENDED_RELEASE_TABLET | Freq: Every day | ORAL | Status: DC
  Administered 2014-07-02 – 2014-07-03 (×2): 10 meq via ORAL
  Filled 2014-07-01 (×4): qty 1

## 2014-07-01 MED ORDER — LIDOCAINE-EPINEPHRINE (PF) 1 %-1:200000 IJ SOLN
INTRAMUSCULAR | Status: AC
  Filled 2014-07-01: qty 30

## 2014-07-01 MED ORDER — ACETAMINOPHEN 325 MG PO TABS
650.0000 mg | ORAL_TABLET | Freq: Four times a day (QID) | ORAL | Status: DC | PRN
  Administered 2014-07-02: 650 mg via ORAL
  Filled 2014-07-01: qty 2

## 2014-07-01 MED ORDER — SODIUM CHLORIDE 0.9 % IV BOLUS (SEPSIS)
1000.0000 mL | Freq: Once | INTRAVENOUS | Status: AC
  Administered 2014-07-01: 1000 mL via INTRAVENOUS

## 2014-07-01 MED ORDER — SODIUM CHLORIDE 0.9 % IV SOLN
1400.0000 mg | Freq: Once | INTRAVENOUS | Status: AC
  Administered 2014-07-01: 1400 mg via INTRAVENOUS
  Filled 2014-07-01: qty 28

## 2014-07-01 MED ORDER — POTASSIUM CHLORIDE IN NACL 20-0.9 MEQ/L-% IV SOLN
INTRAVENOUS | Status: DC
  Administered 2014-07-01 – 2014-07-02 (×2): via INTRAVENOUS
  Filled 2014-07-01 (×4): qty 1000

## 2014-07-01 MED ORDER — HEPARIN SODIUM (PORCINE) 5000 UNIT/ML IJ SOLN
5000.0000 [IU] | Freq: Three times a day (TID) | INTRAMUSCULAR | Status: DC
Start: 2014-07-01 — End: 2014-07-03
  Administered 2014-07-01 – 2014-07-03 (×6): 5000 [IU] via SUBCUTANEOUS
  Filled 2014-07-01 (×6): qty 1

## 2014-07-01 MED ORDER — TETANUS-DIPHTH-ACELL PERTUSSIS 5-2.5-18.5 LF-MCG/0.5 IM SUSP: 0.5000 mL | Freq: Once | INTRAMUSCULAR | Status: AC

## 2014-07-01 MED ORDER — DOCUSATE SODIUM 100 MG PO CAPS
100.0000 mg | ORAL_CAPSULE | Freq: Two times a day (BID) | ORAL | Status: DC | PRN
  Administered 2014-07-02 – 2014-07-03 (×2): 100 mg via ORAL
  Filled 2014-07-01 (×3): qty 1

## 2014-07-01 MED ORDER — TETANUS-DIPHTH-ACELL PERTUSSIS 5-2.5-18.5 LF-MCG/0.5 IM SUSP
INTRAMUSCULAR | Status: AC
  Administered 2014-07-01: 0.5 mL via INTRAMUSCULAR
  Filled 2014-07-01: qty 0.5

## 2014-07-01 MED ORDER — SENNA 8.6 MG PO TABS
1.0000 | ORAL_TABLET | Freq: Every day | ORAL | Status: DC | PRN
  Administered 2014-07-02 – 2014-07-03 (×2): 8.6 mg via ORAL
  Filled 2014-07-01 (×2): qty 1

## 2014-07-01 MED ORDER — PHENYTOIN SODIUM EXTENDED 100 MG PO CAPS
300.0000 mg | ORAL_CAPSULE | Freq: Every day | ORAL | Status: DC
  Administered 2014-07-02: 20:00:00 300 mg via ORAL
  Filled 2014-07-01 (×2): qty 3

## 2014-07-01 MED ORDER — ACETAMINOPHEN 650 MG RE SUPP: 650.0000 mg | Freq: Four times a day (QID) | RECTAL | Status: DC | PRN

## 2014-07-01 MED ORDER — LORAZEPAM 2 MG/ML IJ SOLN
1.0000 mg | Freq: Once | INTRAMUSCULAR | Status: AC
  Administered 2014-07-01: 1 mg via INTRAVENOUS

## 2014-07-01 NOTE — Consult Note (Signed)
CC: seizure  HPI: Sherry Boyd is an 49 y.o. female  with a known history of seizure disorder on Dilantin, chronic hypokalemia, hypertension was brought in by EMS secondary to seizure episode. Patient is postictal and unable to provide any history at this time. In the ED pt had another generalized seizure activity. Pt's dilantin level was very low consistent with pt not taking it.  S/p reload.  CTH no acute intracranial abnormality.     Past Medical History  Diagnosis Date  . Hypertension   . Seizures   . Hyperlipidemia     Past Surgical History  Procedure Laterality Date  . Cholecystectomy      History reviewed. No pertinent family history.  Social History:  reports that she has quit smoking. She does not have any smokeless tobacco history on file. She reports that she does not drink alcohol. Her drug history is not on file.  No Known Allergies  Medications: I have reviewed the patient's current medications.  ROS: Unable to obtain as not following commands.   Physical Examination: Blood pressure 126/69, pulse 79, temperature 97.6 F (36.4 C), temperature source Oral, resp. rate 14, height  (1.575 m), weight 70.262 kg (154 lb 14.4 oz), SpO2 100 %.  Able to follow simple commands Withdrawal from painful stimuli b/l upper and lower extremities Pupils 3mm and sluggish to react Positive corneal.   Laboratory Studies:   Basic Metabolic Panel:  Recent Labs Lab 07/01/14 1207  NA 138  K 2.6*  CL 105  CO2 25  GLUCOSE 96  BUN 12  CREATININE 0.89  CALCIUM 8.0*    Liver Function Tests: No results for input(s): AST, ALT, ALKPHOS, BILITOT, PROT, ALBUMIN in the last 168 hours. No results for input(s): LIPASE, AMYLASE in the last 168 hours. No results for input(s): AMMONIA in the last 168 hours.  CBC:  Recent Labs Lab 07/01/14 1207  WBC 10.2  HGB 12.5  HCT 37.5  MCV 96.3  PLT 153    Cardiac Enzymes: No results for input(s): CKTOTAL, CKMB, CKMBINDEX,  TROPONINI in the last 168 hours.  BNP: Invalid input(s): POCBNP  CBG:  Recent Labs Lab 07/01/14 1153  GLUCAP 92    Microbiology: Results for orders placed or performed in visit on 02/22/11  Influenza A&B Antigens [facility])     Status: None   Collection Time: 02/22/11  9:05 AM  Result Value Ref Range Status   Micro Text Report   Final       COMMENT                   NEGATIVE FOR INFLUENZA A (ANTIGEN ABSENT)   COMMENT                   NEGATIVE FOR INFLUENZA B (ANTIGEN ABSENT)   ANTIBIOTIC                                                        Coagulation Studies: No results for input(s): LABPROT, INR in the last 72 hours.  Urinalysis:  Recent Labs Lab 07/01/14 1313  COLORURINE STRAW*  LABSPEC 1.006  PHURINE 7.0  GLUCOSEU NEGATIVE  HGBUR 1+*  BILIRUBINUR NEGATIVE  KETONESUR NEGATIVE  PROTEINUR 30*  NITRITE NEGATIVE  LEUKOCYTESUR NEGATIVE    Lipid Panel:  No results found for:  CHOL, TRIG, HDL, CHOLHDL, VLDL, LDLCALC  HgbA1C: No results found for: HGBA1C  Urine Drug Screen:     Component Value Date/Time   LABOPIA NEGATIVE 07/11/2011 0156   LABBENZ NEGATIVE 07/11/2011 0156   AMPHETMU NEGATIVE 07/11/2011 0156   THCU NEGATIVE 07/11/2011 0156   LABBARB NEGATIVE 07/11/2011 0156    Alcohol Level: No results for input(s): ETH in the last 168 hours.  Other results: EKG: normal EKG, normal sinus rhythm, unchanged from previous tracings.  Imaging: Dg Chest 1 View  07/01/2014   CLINICAL DATA:  Seizures.  Unresponsive patient.  Incontinence.  EXAM: CHEST  1 VIEW  COMPARISON:  07/11/2009  FINDINGS: Low lung volumes are present, causing crowding of the pulmonary vasculature. Prominence of upper zone pulmonary vasculature noted with mild airway thickening. No airspace opacity observed. Heart size within normal limits. No blunting of the costophrenic angles.  IMPRESSION: 1. Airway thickening is present, suggesting bronchitis or reactive airways disease. 2. Upper zone  pulmonary vascular prominence -pulmonary venous hypertension not excluded, but there is no overt cardiomegaly.   Electronically Signed   By: Gaylyn Rong M.D.   On: 07/01/2014 14:01   Ct Head Wo Contrast  07/01/2014   CLINICAL DATA:  Patient found unconscious with face and lips laceration. Lethargy and neck pain. History of seizure disorder.  EXAM: CT HEAD WITHOUT CONTRAST  CT MAXILLOFACIAL WITHOUT CONTRAST  CT CERVICAL SPINE WITHOUT CONTRAST  TECHNIQUE: Multidetector CT imaging of the head, cervical spine, and maxillofacial structures were performed using the standard protocol without intravenous contrast. Multiplanar CT image reconstructions of the cervical spine and maxillofacial structures were also generated.  COMPARISON:  Head CT 11/23/2012  FINDINGS: CT HEAD FINDINGS  Skull and Sinuses:Negative for fracture or destructive process. No hyperostosis.  Orbits: No acute abnormality.  Brain: No evidence of acute infarction, hemorrhage, hydrocephalus, or mass lesion/mass effect. 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 No cortical findings to explain the seizure focus.  CT MAXILLOFACIAL FINDINGS  Soft tissue swelling about the mouth without underlying fracture. The mandible is intact and located. No evidence of globe injury or postseptal hematoma.  No incidental sinusitis.  Multiple devitalized teeth with periapical erosions around the left upper premolars, right lower terminal molar, and left lower second molar.  CT CERVICAL SPINE FINDINGS  Negative for acute fracture or subluxation. No prevertebral edema. No gross cervical canal hematoma.  Mild dorsal endplate ridging at C5-6 and C6-7. No significant osseous canal or foraminal stenosis.  Symmetric enlargement and multi nodularity of the thyroid gland. The largest nodule visualized measures 12 mm. No gross adenopathy in the neck.  IMPRESSION: 1. No acute intracranial findings. No evidence of facial or cervical spine  fracture. 2. 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 3. Multi nodular goiter.   Electronically Signed   By: Marnee Spring M.D.   On: 07/01/2014 13:10   Ct Cervical Spine Wo Contrast  07/01/2014   CLINICAL DATA:  Patient found unconscious with face and lips laceration. Lethargy and neck pain. History of seizure disorder.  EXAM: CT HEAD WITHOUT CONTRAST  CT MAXILLOFACIAL WITHOUT CONTRAST  CT CERVICAL SPINE WITHOUT CONTRAST  TECHNIQUE: Multidetector CT imaging of the head, cervical spine, and maxillofacial structures were performed using the standard protocol without intravenous contrast. Multiplanar CT image reconstructions of the cervical spine and maxillofacial structures were also generated.  COMPARISON:  Head CT 11/23/2012  FINDINGS: CT HEAD FINDINGS  Skull and Sinuses:Negative for fracture or destructive process. No hyperostosis.  Orbits:  No acute abnormality.  Brain: No evidence of acute infarction, hemorrhage, hydrocephalus, or mass lesion/mass effect. 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 No cortical findings to explain the seizure focus.  CT MAXILLOFACIAL FINDINGS  Soft tissue swelling about the mouth without underlying fracture. The mandible is intact and located. No evidence of globe injury or postseptal hematoma.  No incidental sinusitis.  Multiple devitalized teeth with periapical erosions around the left upper premolars, right lower terminal molar, and left lower second molar.  CT CERVICAL SPINE FINDINGS  Negative for acute fracture or subluxation. No prevertebral edema. No gross cervical canal hematoma.  Mild dorsal endplate ridging at C5-6 and C6-7. No significant osseous canal or foraminal stenosis.  Symmetric enlargement and multi nodularity of the thyroid gland. The largest nodule visualized measures 12 mm. No gross adenopathy in the neck.  IMPRESSION: 1. No acute intracranial findings. No evidence of facial or cervical spine  fracture. 2. 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 3. Multi nodular goiter.   Electronically Signed   By: Marnee Spring M.D.   On: 07/01/2014 13:10   Ct Maxillofacial Wo Cm  07/01/2014   CLINICAL DATA:  Patient found unconscious with face and lips laceration. Lethargy and neck pain. History of seizure disorder.  EXAM: CT HEAD WITHOUT CONTRAST  CT MAXILLOFACIAL WITHOUT CONTRAST  CT CERVICAL SPINE WITHOUT CONTRAST  TECHNIQUE: Multidetector CT imaging of the head, cervical spine, and maxillofacial structures were performed using the standard protocol without intravenous contrast. Multiplanar CT image reconstructions of the cervical spine and maxillofacial structures were also generated.  COMPARISON:  Head CT 11/23/2012  FINDINGS: CT HEAD FINDINGS  Skull and Sinuses:Negative for fracture or destructive process. No hyperostosis.  Orbits: No acute abnormality.  Brain: No evidence of acute infarction, hemorrhage, hydrocephalus, or mass lesion/mass effect. 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 No cortical findings to explain the seizure focus.  CT MAXILLOFACIAL FINDINGS  Soft tissue swelling about the mouth without underlying fracture. The mandible is intact and located. No evidence of globe injury or postseptal hematoma.  No incidental sinusitis.  Multiple devitalized teeth with periapical erosions around the left upper premolars, right lower terminal molar, and left lower second molar.  CT CERVICAL SPINE FINDINGS  Negative for acute fracture or subluxation. No prevertebral edema. No gross cervical canal hematoma.  Mild dorsal endplate ridging at C5-6 and C6-7. No significant osseous canal or foraminal stenosis.  Symmetric enlargement and multi nodularity of the thyroid gland. The largest nodule visualized measures 12 mm. No gross adenopathy in the neck.  IMPRESSION: 1. No acute intracranial findings. No evidence of facial or cervical spine fracture. 2.  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 3. Multi nodular goiter.   Electronically Signed   By: Marnee Spring M.D.   On: 07/01/2014 13:10     Assessment/Plan: 49 y.o. female  with a known history of seizure disorder on Dilantin, chronic hypokalemia, hypertension was brought in by EMS secondary to seizure episode. Patient is postictal and unable to provide any history at this time. In the ED pt had another generalized seizure activity. Pt's dilantin level was very low consistent with pt not taking it.  S/p reload.  CTH no acute intracranial abnormality. Pt was loaded with dilantin  ( /kg).  -restart dilantin 300 nightly - encourage dilantin use as she is non compliant with it -call with questions.   07/01/2014, 3:22 PM

## 2014-07-01 NOTE — Plan of Care (Signed)
Problem: Discharge Progression Outcomes Goal: Barriers To Progression Addressed/Resolved Individualization Outcome: Progressing Address patient as Sherry Boyd. Patient lives at home with son. Patient is a high falls risk, offer toileting every hour. Patient has history of HTN, seizures, hyperlipidema controlled with home medications. Goal: Other Discharge Outcomes/Goals Outcome: Progressing Plan of Care Progressing to Goal: Patient is unresponsive. No indications of pain. Continues on 3L oxygen. Patient remains NPO. VSS. Potassium bolus given.

## 2014-07-01 NOTE — ED Notes (Signed)
Patient remains on O2 2L via [place], sats 100%, audible gurgling, buccal cavities were suctioned with yankuer. Patients remains post-ictal, unresponsive to voice.

## 2014-07-01 NOTE — ED Notes (Signed)
Pt returned from ct at this time

## 2014-07-01 NOTE — ED Provider Notes (Addendum)
[facility] Emergency Department Provider Note  ____________________________________________  Time seen: Approximately 1310 PM  I have reviewed the triage vital signs and the nursing notes.   HISTORY  Chief Complaint Seizures    HPI Sherry Boyd is a 49 y.o. femalewith a history of seizure disorder presents after seizure at home. Was found by her neighbor who called 911. Patient did sustain a left black. Was unable to see patient before see second time in the emergency department. However, was seen by nurse and was at baseline mental status at that time. Patient then had second seizure for about 30 seconds in the emergency department which was tonic-clonic. Post ictal afterward.   Past Medical History  Diagnosis Date  . Hypertension   . Seizures   . Hyperlipidemia     There are no active problems to display for this patient.   Past Surgical History  Procedure Laterality Date  . Cholecystectomy      Current Outpatient Rx  Name  Route  Sig  Dispense  Refill  . phenytoin (DILANTIN) 100 MG ER capsule   Oral   Take 300 mg by mouth at bedtime.           Allergies Review of patient's allergies indicates no known allergies.  History reviewed. No pertinent family history.  Social History History  Substance Use Topics  . Smoking status: Former Games developer  . Smokeless tobacco: Not on file  . Alcohol Use: No    Review of Systems Confounded because patient postictal.  ____________________________________________   PHYSICAL EXAM:  VITAL SIGNS: ED Triage Vitals  Enc Vitals Group     BP 07/01/14 1135 124/70 mmHg     Pulse Rate 07/01/14 1135 74     Resp 07/01/14 1241 18     Temp 07/01/14 1135 97.6 F (36.4 C)     Temp Source 07/01/14 1135 Oral     SpO2 07/01/14 1130 98 %     Weight 07/01/14 1135 154 lb 14.4 oz (70.262 kg)     Height 07/01/14 1135 5\' 2"  (1.575 m)     Head Cir --      Peak Flow --      Pain Score 07/01/14 1137 5      Pain Loc --      Pain Edu? --      Excl. in GC? --     Constitutional: Postictal period confused. Eyes: Conjunctivae are normal. PERRL. EOMI. Head: Lip black to the central part of the upper lip not involving the vermilion border. Nose: Crusted blood the left naris. Mouth/Throat: Mucous membranes are moist.  Oropharynx non-erythematous. Neck: No stridor.   Cardiovascular: Normal rate, regular rhythm. Grossly normal heart sounds.  Good peripheral circulation. Respiratory: Normal respiratory effort.  No retractions. Lungs CTAB. Gastrointestinal: Soft and nontender. No distention. No abdominal bruits. No CVA tenderness. Musculoskeletal: No lower extremity tenderness nor edema.  No joint effusions. Neurologic:  Normal speech and language. No gross focal neurologic deficits are appreciated. Speech is normal. No gait instability. Skin:  Skin is warm, dry and intact. No rash noted. Psychiatric: Mood and affect are normal. Speech and behavior are normal.  ____________________________________________   LABS (all labs ordered are listed, but only abnormal results are displayed)  Labs Reviewed  BASIC METABOLIC PANEL - Abnormal; Notable for the following:    Potassium 2.6 (*)    Calcium 8.0 (*)    All other components within normal limits  PHENYTOIN LEVEL, TOTAL - Abnormal; Notable for  the following:    Phenytoin Lvl <2.5 (*)    All other components within normal limits  URINALYSIS COMPLETEWITH MICROSCOPIC ([facility] ONLY) - Abnormal; Notable for the following:    Color, Urine STRAW (*)    APPearance CLEAR (*)    Hgb urine dipstick 1+ (*)    Protein, ur 30 (*)    Squamous Epithelial / LPF 0-5 (*)    All other components within normal limits  CBC  GLUCOSE, CAPILLARY  CBG MONITORING, ED  POC URINE PREG, ED  POCT PREGNANCY, URINE   ____________________________________________  EKG  ED ECG REPORT I, Lowell Mcgurk,  Teena Irani, the attending physician, personally viewed and interpreted this  ECG.   Date: 07/01/2014  EKG Time: 1140  Rate: 76  Rhythm: normal sinus rhythm  Axis: Normal axis  Intervals:Prolonged QT interval  ST&T Change: No ST elevations or depressions. No abnormal T-wave inversions.  ____________________________________________  RADIOLOGY  CAT scan with no acute intracranial findings. No evidence of facial or cervical spine fracture. ____________________________________________   PROCEDURES  LACERATION REPAIR Performed by: Arelia Longest Authorized by: Arelia Longest Consent: Verbal consent obtained. Risks and benefits: risks, benefits and alternatives were discussed Consent given by: patient Patient identity confirmed: provided demographic data Prepped and Draped in normal sterile fashion Wound explored  Laceration Location: Upper lip to the inner mucosa as well as outer keratinized epithelium  Laceration Length: 2 cm and curvilinear to the keratinized epithelium and 1.5 cm to the upper lip inner mucosa which is linear.  Is not a through and through laceration. Does not involve the vermilion border  No Foreign Bodies seen or palpated  Anesthesia: local infiltration  Local anesthetic: lidocaine 1% with epinephrine  Anesthetic total: 3.5 ml  Irrigation method: syringe Amount of cleaning: standard  Skin closure:   Number of sutures: 4 sutures simple interrupted to the outside epithelium and 3 sutures simple interrupted to the mucosal area.   Technique: 5-0 Vicryl used for both lacerations.   Patient tolerance: Patient tolerated the procedure well with no immediate complications.    ____________________________________________   INITIAL IMPRESSION / ASSESSMENT AND PLAN / ED COURSE  Pertinent labs & imaging results that were available during my care of the patient were reviewed by me and considered in my medical decision making (see chart for details).  ----------------------------------------- 2:16 PM on  07/01/2014 -----------------------------------------  Patient still postictal but responsive to painful stimuli. 100% on room air and protecting airway at this time. We'll admit to the hospital. Signed out to Dr. Nemiah Commander. Will load with Dilantin as well as replete potassium. Found to be so there appeared on her Dilantin. ____________________________________________   FINAL CLINICAL IMPRESSION(S) / ED DIAGNOSES  Acute on chronic seizures. Per long postictal state. Acute, return visit.      Myrna Blazer, MD 07/01/14 1417  Bilateral infraorbital nerve block used to numb patient prior to suturing.  Myrna Blazer, MD 07/01/14 [phone]

## 2014-07-01 NOTE — H&P (Signed)
[facility] [place] [facility]   PATIENT NAME: Sherry Boyd    MR#:  [id number]  DATE OF BIRTH:  [birth year]  DATE OF ADMISSION:  07/01/2014  PRIMARY CARE PHYSICIAN: Dr. Beverely Risen  REQUESTING/REFERRING PHYSICIAN: Dr. Pershing Proud  CHIEF COMPLAINT:   Chief Complaint  Patient presents with  . Seizures    HISTORY OF PRESENT ILLNESS:  Sherry Boyd  is a 49 y.o. female with a known history of seizure disorder on Dilantin, chronic hypokalemia, hypertension was brought in by EMS secondary to seizure episode. Patient is postictal and unable to provide any history at this time. No family members or friends here at the hospital. The phone numbers of her family in the computer were tried, unable to reach them at this time. Currently patient did have a seizure last night, she was noticed to be unresponsive by her neighbor when he went to check on her today. Then EMS was called. Patient already had a lip laceration at the time. She is on Dilantin for her seizures and her Dilantin level in the ER is extremely low. She was more alert by the time she came to the emergency room went to have a CT of her head. after she came back from her CT she was noticed to have a Tonic-clonic seizure for almost 30 seconds and has received some Ativan. Patient is again postictal at this time arousable and following simple commands but unable to provide any history.  PAST MEDICAL HISTORY:   Past Medical History  Diagnosis Date  . Hypertension   . Seizures   . Hyperlipidemia     PAST SURGICAL HISTORY:   Past Surgical History  Procedure Laterality Date  . Cholecystectomy      SOCIAL HISTORY:   History  Substance Use Topics  . Smoking status: Former Games developer  . Smokeless tobacco: Not on file  . Alcohol Use: No    FAMILY HISTORY:  History reviewed. No pertinent family history. Unable to be obtained secondary to patient's unresponsiveness  DRUG ALLERGIES:  No Known  Allergies  REVIEW OF SYSTEMS:   Review of Systems  Unable to perform ROS: patient unresponsive    MEDICATIONS AT HOME:   Prior to Admission medications   Medication Sig Start Date End Date Taking? Authorizing Provider  amLODipine (NORVASC) 5 MG tablet Take 5 mg by mouth daily.   Yes Historical Provider, MD  FLUoxetine (PROZAC) 10 MG capsule Take 10 mg by mouth daily.   Yes Historical Provider, MD  phenytoin (DILANTIN) 100 MG ER capsule Take 300 mg by mouth at bedtime.   Yes Historical Provider, MD  potassium chloride (K-DUR,KLOR-CON) 10 MEQ tablet Take 10 mEq by mouth daily.   Yes Historical Provider, MD      VITAL SIGNS:  Blood pressure 131/77, pulse 79, temperature 97.6 F (36.4 C), temperature source Oral, resp. rate 16, height 5\' 2"  (1.575 m), weight 70.262 kg (154 lb 14.4 oz), SpO2 100 %.  PHYSICAL EXAMINATION:   Physical Exam  GENERAL:  49 y.o.-year-old patient lying in the bed and unresponsive.  EYES: Pupils equal, round, reactive to light and accommodation. No scleral icterus. Extraocular muscles intact.  HEENT: Head atraumatic, normocephalic. Oropharynx and nasopharynx clear.  There is a laceration on her upper lip towards her left side, that has been sutured. NECK:  Supple, no jugular venous distention. No thyroid enlargement, no tenderness. Cervical collar placed LUNGS: Normal breath sounds bilaterally, no wheezing, rales,rhonchi or crepitation. No use of accessory muscles of  respiration. Decreased bibasilar breath sounds CARDIOVASCULAR: S1, S2 normal. No murmurs, rubs, or gallops.  ABDOMEN: Soft, nontender, nondistended. Bowel sounds present. No organomegaly or mass.  EXTREMITIES: No pedal edema, cyanosis, or clubbing.  NEUROLOGIC: No facial droop noted. Patient is unresponsive, arousable and following simple commands. Able to move all 4 extremities.  PSYCHIATRIC: The patient is lethargic   SKIN: No obvious rash, lesion, or ulcer. There's a laceration on the lip that  has been sutured  LABORATORY PANEL:   CBC  Recent Labs Lab 07/01/14 1207  WBC 10.2  HGB 12.5  HCT 37.5  PLT 153   ------------------------------------------------------------------------------------------------------------------  Chemistries   Recent Labs Lab 07/01/14 1207  NA 138  K 2.6*  CL 105  CO2 25  GLUCOSE 96  BUN 12  CREATININE 0.89  CALCIUM 8.0*   ------------------------------------------------------------------------------------------------------------------  Cardiac Enzymes No results for input(s): TROPONINI in the last 168 hours. ------------------------------------------------------------------------------------------------------------------  RADIOLOGY:  Dg Chest 1 View  07/01/2014   CLINICAL DATA:  Seizures.  Unresponsive patient.  Incontinence.  EXAM: CHEST  1 VIEW  COMPARISON:  07/11/2009  FINDINGS: Low lung volumes are present, causing crowding of the pulmonary vasculature. Prominence of upper zone pulmonary vasculature noted with mild airway thickening. No airspace opacity observed. Heart size within normal limits. No blunting of the costophrenic angles.  IMPRESSION: 1. Airway thickening is present, suggesting bronchitis or reactive airways disease. 2. Upper zone pulmonary vascular prominence -pulmonary venous hypertension not excluded, but there is no overt cardiomegaly.   Electronically Signed   By: Gaylyn Rong M.D.   On: 07/01/2014 14:01   Ct Head Wo Contrast  07/01/2014   CLINICAL DATA:  Patient found unconscious with face and lips laceration. Lethargy and neck pain. History of seizure disorder.  EXAM: CT HEAD WITHOUT CONTRAST  CT MAXILLOFACIAL WITHOUT CONTRAST  CT CERVICAL SPINE WITHOUT CONTRAST  TECHNIQUE: Multidetector CT imaging of the head, cervical spine, and maxillofacial structures were performed using the standard protocol without intravenous contrast. Multiplanar CT image reconstructions of the cervical spine and maxillofacial  structures were also generated.  COMPARISON:  Head CT 11/23/2012  FINDINGS: CT HEAD FINDINGS  Skull and Sinuses:Negative for fracture or destructive process. No hyperostosis.  Orbits: No acute abnormality.  Brain: No evidence of acute infarction, hemorrhage, hydrocephalus, or mass lesion/mass effect. 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 No cortical findings to explain the seizure focus.  CT MAXILLOFACIAL FINDINGS  Soft tissue swelling about the mouth without underlying fracture. The mandible is intact and located. No evidence of globe injury or postseptal hematoma.  No incidental sinusitis.  Multiple devitalized teeth with periapical erosions around the left upper premolars, right lower terminal molar, and left lower second molar.  CT CERVICAL SPINE FINDINGS  Negative for acute fracture or subluxation. No prevertebral edema. No gross cervical canal hematoma.  Mild dorsal endplate ridging at C5-6 and C6-7. No significant osseous canal or foraminal stenosis.  Symmetric enlargement and multi nodularity of the thyroid gland. The largest nodule visualized measures 12 mm. No gross adenopathy in the neck.  IMPRESSION: 1. No acute intracranial findings. No evidence of facial or cervical spine fracture. 2. 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 3. Multi nodular goiter.   Electronically Signed   By: Marnee Spring M.D.   On: 07/01/2014 13:10   Ct Cervical Spine Wo Contrast  07/01/2014   CLINICAL DATA:  Patient found unconscious with face and lips laceration. Lethargy and neck pain. History of seizure disorder.  EXAM:  CT HEAD WITHOUT CONTRAST  CT MAXILLOFACIAL WITHOUT CONTRAST  CT CERVICAL SPINE WITHOUT CONTRAST  TECHNIQUE: Multidetector CT imaging of the head, cervical spine, and maxillofacial structures were performed using the standard protocol without intravenous contrast. Multiplanar CT image reconstructions of the cervical spine and maxillofacial  structures were also generated.  COMPARISON:  Head CT 11/23/2012  FINDINGS: CT HEAD FINDINGS  Skull and Sinuses:Negative for fracture or destructive process. No hyperostosis.  Orbits: No acute abnormality.  Brain: No evidence of acute infarction, hemorrhage, hydrocephalus, or mass lesion/mass effect. 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 No cortical findings to explain the seizure focus.  CT MAXILLOFACIAL FINDINGS  Soft tissue swelling about the mouth without underlying fracture. The mandible is intact and located. No evidence of globe injury or postseptal hematoma.  No incidental sinusitis.  Multiple devitalized teeth with periapical erosions around the left upper premolars, right lower terminal molar, and left lower second molar.  CT CERVICAL SPINE FINDINGS  Negative for acute fracture or subluxation. No prevertebral edema. No gross cervical canal hematoma.  Mild dorsal endplate ridging at C5-6 and C6-7. No significant osseous canal or foraminal stenosis.  Symmetric enlargement and multi nodularity of the thyroid gland. The largest nodule visualized measures 12 mm. No gross adenopathy in the neck.  IMPRESSION: 1. No acute intracranial findings. No evidence of facial or cervical spine fracture. 2. 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 3. Multi nodular goiter.   Electronically Signed   By: Marnee Spring M.D.   On: 07/01/2014 13:10   Ct Maxillofacial Wo Cm  07/01/2014   CLINICAL DATA:  Patient found unconscious with face and lips laceration. Lethargy and neck pain. History of seizure disorder.  EXAM: CT HEAD WITHOUT CONTRAST  CT MAXILLOFACIAL WITHOUT CONTRAST  CT CERVICAL SPINE WITHOUT CONTRAST  TECHNIQUE: Multidetector CT imaging of the head, cervical spine, and maxillofacial structures were performed using the standard protocol without intravenous contrast. Multiplanar CT image reconstructions of the cervical spine and maxillofacial structures  were also generated.  COMPARISON:  Head CT 11/23/2012  FINDINGS: CT HEAD FINDINGS  Skull and Sinuses:Negative for fracture or destructive process. No hyperostosis.  Orbits: No acute abnormality.  Brain: No evidence of acute infarction, hemorrhage, hydrocephalus, or mass lesion/mass effect. 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 No cortical findings to explain the seizure focus.  CT MAXILLOFACIAL FINDINGS  Soft tissue swelling about the mouth without underlying fracture. The mandible is intact and located. No evidence of globe injury or postseptal hematoma.  No incidental sinusitis.  Multiple devitalized teeth with periapical erosions around the left upper premolars, right lower terminal molar, and left lower second molar.  CT CERVICAL SPINE FINDINGS  Negative for acute fracture or subluxation. No prevertebral edema. No gross cervical canal hematoma.  Mild dorsal endplate ridging at C5-6 and C6-7. No significant osseous canal or foraminal stenosis.  Symmetric enlargement and multi nodularity of the thyroid gland. The largest nodule visualized measures 12 mm. No gross adenopathy in the neck.  IMPRESSION: 1. No acute intracranial findings. No evidence of facial or cervical spine fracture. 2. 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 3. Multi nodular goiter.   Electronically Signed   By: Marnee Spring M.D.   On: 07/01/2014 13:10    EKG:   Orders placed or performed during the hospital encounter of 07/01/14  . EKG (if new onset seizures)  . EKG (if new onset seizures)    IMPRESSION AND PLAN:   Marylene Land  Esqueda  is a 49 y.o. female with a known history of seizure disorder on Dilantin, chronic hypokalemia, hypertension was brought in by EMS secondary to seizure episode.  #1 seizure-no history of seizure disorder. -Dilantin level is extremely low, not sure if she was not taking the medicine. -Loaded with Dilantin here in the emergency  room. -Continue her Dilantin dose in the hospital. -Nephrology has been consulted. -CT of the head did not show any acute changes. 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 -Follow and seizure precautions. -Replace her electrolytes.  #2 hypokalemia-likely known history of hypokalemia, on chronic potassium supplements. -Continue IV supplements at this time. -Also check a magnesium level  #3 hypertension-continue Norvasc  #4 depression-continue Prozac.  # 5 DVT prophylaxis-subcutaneous heparin.    All the records are reviewed and case discussed with ED provider. Management plans discussed with the patient, family and they are in agreement.  CODE STATUS: Full code  TOTAL TIME TAKING CARE OF THIS PATIENT: 50 minutes.    Enid Baas M.D on 07/01/2014 at 2:33 PM  Between 7am to 6pm - Pager - [phone]  After 6pm go to [email or web address] - password [email or web address]  [facility]  Office  [phone]  CC: Primary care physician; Pcp Not In System

## 2014-07-01 NOTE — ED Notes (Signed)
Per EMS report, patient was found unconscious on the floor by a neighbor who was coming to take her out. Per report, patient has a hx. Of seizures and had one last night. Per EMS report, patient does not recall events this morning, has laceration to upper lip, but was able to recall name and birthdate when they arrived to the scene.

## 2014-07-01 NOTE — ED Notes (Signed)
Suctioned Patient

## 2014-07-01 NOTE — ED Notes (Signed)
Patient had a witness tonic-clonic seizure. MD was informed. Buccal cavity was suctioned for secretions and patient was placed on 2L O2 via [place].  1mg  Ativan IV was given. Patient is unresponsive, breathing spontaneously with snoring respirations. Patient was incontinent to urine.

## 2014-07-01 NOTE — ED Notes (Signed)
Patient exhibited response to localized pain while Dr. Pershing Proud numbed and stitched upper lip. Patient did not open eyes, went back to sleep with snoring shortly after sutures were placed. Side rails remains covered with blankets. VSS.

## 2014-07-01 NOTE — Plan of Care (Addendum)
Problem: Discharge Progression Outcomes Goal: Discharge plan in place and appropriate Individualization of Care Hx of seizure disorder on Dilantin, chronic hypokalemia, hypertension HIGH Fall Precautions Seizure Precautions    Goal: Other Discharge Outcomes/Goals Plan of Care Progress to Goal:  Pt admitted for seizures at home and in the ED, pt noted with stitched to upper lip from biting lip during seizure, pt lethargic at present, bedrails padded x 3 and up x 3, unable to take po medications at this time.  No active seizures noted at this time.

## 2014-07-02 LAB — BASIC METABOLIC PANEL
ANION GAP: 8 (ref 5–15)
BUN: 6 mg/dL (ref 6–20)
CO2: 25 mmol/L (ref 22–32)
CREATININE: 0.73 mg/dL (ref 0.44–1.00)
Calcium: 7.9 mg/dL — ABNORMAL LOW (ref 8.9–10.3)
Chloride: 109 mmol/L (ref 101–111)
GFR calc Af Amer: >60 mL/min (ref >60)
GFR calc non Af Amer: >60 mL/min (ref >60)
Glucose, Bld: 78 mg/dL (ref 65–99)
Potassium: 3.3 mmol/L — ABNORMAL LOW (ref 3.5–5.1)
SODIUM: 142 mmol/L (ref 135–145)

## 2014-07-02 LAB — CBC
HCT: 35.6 % (ref 35.0–47.0)
HEMOGLOBIN: 12 g/dL (ref 12.0–16.0)
MCH: 32 pg (ref 26.0–34.0)
MCHC: 33.7 g/dL (ref 32.0–36.0)
MCV: 95.1 fL (ref 80.0–100.0)
Platelets: 139 10*3/uL — ABNORMAL LOW (ref 150–440)
RBC: 3.74 MIL/uL — ABNORMAL LOW (ref 3.80–5.20)
RDW: 12.9 % (ref 11.5–14.5)
WBC: 9 10*3/uL (ref 3.6–11.0)

## 2014-07-02 LAB — PHENYTOIN LEVEL, TOTAL: PHENYTOIN LVL: 18.4 ug/mL (ref 10.0–20.0)

## 2014-07-02 MED ORDER — SODIUM CHLORIDE 0.9 % IJ SOLN
3.0000 mL | Freq: Three times a day (TID) | INTRAMUSCULAR | Status: DC
  Administered 2014-07-02 – 2014-07-03 (×3): 3 mL via INTRAVENOUS

## 2014-07-02 MED ORDER — PNEUMOCOCCAL VAC POLYVALENT 25 MCG/0.5ML IJ INJ
0.5000 mL | INJECTION | INTRAMUSCULAR | Status: AC
  Administered 2014-07-03: 0.5 mL via INTRAMUSCULAR
  Filled 2014-07-02: qty 0.5

## 2014-07-02 NOTE — Care Management (Signed)
Physical therapy evaluation completed. Recommends home with home health and physical therapy. States she walked to the door and back with physical therapy. Spoke with Ms. Chai at the bedside. States her son is usually there with all the time. 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 Also, she has not had a seizure in 2 years.  Discussed plans with Dr. Allena Katz. Discharge to home tomorrow. Gwenette Greet RN MSN Care Management [phone]

## 2014-07-02 NOTE — Consult Note (Signed)
CC: seizure  HPI: Sherry Boyd is an 49 y.o. female  with a known history of seizure disorder on Dilantin, chronic hypokalemia, hypertension was brought in by EMS secondary to seizure episode. Patient is postictal and unable to provide any history at this time. In the ED pt had another generalized seizure activity. Pt's dilantin level was very low consistent with pt not taking it.  S/p reload.  CTH no acute intracranial abnormality.    Mental status much improved. Close to baseline  Past Medical History  Diagnosis Date  . Hypertension   . Seizures   . Hyperlipidemia     Past Surgical History  Procedure Laterality Date  . Cholecystectomy      History reviewed. No pertinent family history.  Social History:  reports that she has quit smoking. She does not have any smokeless tobacco history on file. She reports that she does not drink alcohol. Her drug history is not on file.  No Known Allergies  Medications: I have reviewed the patient's current medications.  ROS: Unable to obtain as not following commands.   Physical Examination: Blood pressure 131/70, pulse 69, temperature 99.5 F (37.5 C), temperature source Oral, resp. rate 16, height  (1.575 m), weight 70.262 kg (154 lb 14.4 oz), SpO2 99 %.  Able to follow simple commands Withdrawal from painful stimuli b/l upper and lower extremities Pupils 3mm and sluggish to react Positive corneal.   Laboratory Studies:   Basic Metabolic Panel:  Recent Labs Lab 07/01/14 1207 07/02/14 0527  NA 138 142  K 2.6* 3.3*  CL 105 109  CO2 25 25  GLUCOSE 96 78  BUN 12 6  CREATININE 0.89 0.73  CALCIUM 8.0* 7.9*  MG 1.5*  --     Liver Function Tests: No results for input(s): AST, ALT, ALKPHOS, BILITOT, PROT, ALBUMIN in the last 168 hours. No results for input(s): LIPASE, AMYLASE in the last 168 hours. No results for input(s): AMMONIA in the last 168 hours.  CBC:  Recent Labs Lab 07/01/14 1207 07/02/14 0527  WBC  10.2 9.0  HGB 12.5 12.0  HCT 37.5 35.6  MCV 96.3 95.1  PLT 153 139*    Cardiac Enzymes: No results for input(s): CKTOTAL, CKMB, CKMBINDEX, TROPONINI in the last 168 hours.  BNP: Invalid input(s): POCBNP  CBG:  Recent Labs Lab 07/01/14 1153  GLUCAP 92    Microbiology: Results for orders placed or performed in visit on 02/22/11  Influenza A&B Antigens [facility])     Status: None   Collection Time: 02/22/11  9:05 AM  Result Value Ref Range Status   Micro Text Report   Final       COMMENT                   NEGATIVE FOR INFLUENZA A (ANTIGEN ABSENT)   COMMENT                   NEGATIVE FOR INFLUENZA B (ANTIGEN ABSENT)   ANTIBIOTIC                                                        Coagulation Studies: No results for input(s): LABPROT, INR in the last 72 hours.  Urinalysis:   Recent Labs Lab 07/01/14 1313  COLORURINE STRAW*  LABSPEC 1.006  PHURINE 7.0  GLUCOSEU NEGATIVE  HGBUR 1+*  BILIRUBINUR NEGATIVE  KETONESUR NEGATIVE  PROTEINUR 30*  NITRITE NEGATIVE  LEUKOCYTESUR NEGATIVE    Lipid Panel:  No results found for: CHOL, TRIG, HDL, CHOLHDL, VLDL, LDLCALC  HgbA1C: No results found for: HGBA1C  Urine Drug Screen:      Component Value Date/Time   LABOPIA NONE DETECTED 07/01/2014 1313   LABBENZ NONE DETECTED 07/01/2014 1313   AMPHETMU NONE DETECTED 07/01/2014 1313   THCU NONE DETECTED 07/01/2014 1313   LABBARB NONE DETECTED 07/01/2014 1313    Alcohol Level: No results for input(s): ETH in the last 168 hours.  Other results: EKG: normal EKG, normal sinus rhythm, unchanged from previous tracings.  Imaging: Dg Chest 1 View  07/01/2014   CLINICAL DATA:  Seizures.  Unresponsive patient.  Incontinence.  EXAM: CHEST  1 VIEW  COMPARISON:  07/11/2009  FINDINGS: Low lung volumes are present, causing crowding of the pulmonary vasculature. Prominence of upper zone pulmonary vasculature noted with mild airway thickening. No airspace opacity observed. Heart size  within normal limits. No blunting of the costophrenic angles.  IMPRESSION: 1. Airway thickening is present, suggesting bronchitis or reactive airways disease. 2. Upper zone pulmonary vascular prominence -pulmonary venous hypertension not excluded, but there is no overt cardiomegaly.   Electronically Signed   By: Gaylyn Rong M.D.   On: 07/01/2014 14:01   Ct Head Wo Contrast  07/01/2014   CLINICAL DATA:  Patient found unconscious with face and lips laceration. Lethargy and neck pain. History of seizure disorder.  EXAM: CT HEAD WITHOUT CONTRAST  CT MAXILLOFACIAL WITHOUT CONTRAST  CT CERVICAL SPINE WITHOUT CONTRAST  TECHNIQUE: Multidetector CT imaging of the head, cervical spine, and maxillofacial structures were performed using the standard protocol without intravenous contrast. Multiplanar CT image reconstructions of the cervical spine and maxillofacial structures were also generated.  COMPARISON:  Head CT 11/23/2012  FINDINGS: CT HEAD FINDINGS  Skull and Sinuses:Negative for fracture or destructive process. No hyperostosis.  Orbits: No acute abnormality.  Brain: No evidence of acute infarction, hemorrhage, hydrocephalus, or mass lesion/mass effect. 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 No cortical findings to explain the seizure focus.  CT MAXILLOFACIAL FINDINGS  Soft tissue swelling about the mouth without underlying fracture. The mandible is intact and located. No evidence of globe injury or postseptal hematoma.  No incidental sinusitis.  Multiple devitalized teeth with periapical erosions around the left upper premolars, right lower terminal molar, and left lower second molar.  CT CERVICAL SPINE FINDINGS  Negative for acute fracture or subluxation. No prevertebral edema. No gross cervical canal hematoma.  Mild dorsal endplate ridging at C5-6 and C6-7. No significant osseous canal or foraminal stenosis.  Symmetric enlargement and multi nodularity of the thyroid  gland. The largest nodule visualized measures 12 mm. No gross adenopathy in the neck.  IMPRESSION: 1. No acute intracranial findings. No evidence of facial or cervical spine fracture. 2. 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 3. Multi nodular goiter.   Electronically Signed   By: Marnee Spring M.D.   On: 07/01/2014 13:10   Ct Cervical Spine Wo Contrast  07/01/2014   CLINICAL DATA:  Patient found unconscious with face and lips laceration. Lethargy and neck pain. History of seizure disorder.  EXAM: CT HEAD WITHOUT CONTRAST  CT MAXILLOFACIAL WITHOUT CONTRAST  CT CERVICAL SPINE WITHOUT CONTRAST  TECHNIQUE: Multidetector CT imaging of the head, cervical spine, and maxillofacial structures were performed using the standard protocol without intravenous contrast. Multiplanar CT  image reconstructions of the cervical spine and maxillofacial structures were also generated.  COMPARISON:  Head CT 11/23/2012  FINDINGS: CT HEAD FINDINGS  Skull and Sinuses:Negative for fracture or destructive process. No hyperostosis.  Orbits: No acute abnormality.  Brain: No evidence of acute infarction, hemorrhage, hydrocephalus, or mass lesion/mass effect. 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 No cortical findings to explain the seizure focus.  CT MAXILLOFACIAL FINDINGS  Soft tissue swelling about the mouth without underlying fracture. The mandible is intact and located. No evidence of globe injury or postseptal hematoma.  No incidental sinusitis.  Multiple devitalized teeth with periapical erosions around the left upper premolars, right lower terminal molar, and left lower second molar.  CT CERVICAL SPINE FINDINGS  Negative for acute fracture or subluxation. No prevertebral edema. No gross cervical canal hematoma.  Mild dorsal endplate ridging at C5-6 and C6-7. No significant osseous canal or foraminal stenosis.  Symmetric enlargement and multi nodularity of the thyroid gland.  The largest nodule visualized measures 12 mm. No gross adenopathy in the neck.  IMPRESSION: 1. No acute intracranial findings. No evidence of facial or cervical spine fracture. 2. 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 3. Multi nodular goiter.   Electronically Signed   By: Marnee Spring M.D.   On: 07/01/2014 13:10   Ct Maxillofacial Wo Cm  07/01/2014   CLINICAL DATA:  Patient found unconscious with face and lips laceration. Lethargy and neck pain. History of seizure disorder.  EXAM: CT HEAD WITHOUT CONTRAST  CT MAXILLOFACIAL WITHOUT CONTRAST  CT CERVICAL SPINE WITHOUT CONTRAST  TECHNIQUE: Multidetector CT imaging of the head, cervical spine, and maxillofacial structures were performed using the standard protocol without intravenous contrast. Multiplanar CT image reconstructions of the cervical spine and maxillofacial structures were also generated.  COMPARISON:  Head CT 11/23/2012  FINDINGS: CT HEAD FINDINGS  Skull and Sinuses:Negative for fracture or destructive process. No hyperostosis.  Orbits: No acute abnormality.  Brain: No evidence of acute infarction, hemorrhage, hydrocephalus, or mass lesion/mass effect. 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 No cortical findings to explain the seizure focus.  CT MAXILLOFACIAL FINDINGS  Soft tissue swelling about the mouth without underlying fracture. The mandible is intact and located. No evidence of globe injury or postseptal hematoma.  No incidental sinusitis.  Multiple devitalized teeth with periapical erosions around the left upper premolars, right lower terminal molar, and left lower second molar.  CT CERVICAL SPINE FINDINGS  Negative for acute fracture or subluxation. No prevertebral edema. No gross cervical canal hematoma.  Mild dorsal endplate ridging at C5-6 and C6-7. No significant osseous canal or foraminal stenosis.  Symmetric enlargement and multi nodularity of the thyroid gland. The largest  nodule visualized measures 12 mm. No gross adenopathy in the neck.  IMPRESSION: 1. No acute intracranial findings. No evidence of facial or cervical spine fracture. 2. 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 3. Multi nodular goiter.   Electronically Signed   By: Marnee Spring M.D.   On: 07/01/2014 13:10     Assessment/Plan: 49 y.o. female  with a known history of seizure disorder on Dilantin, chronic hypokalemia, hypertension was brought in by EMS secondary to seizure episode. Patient is postictal and unable to provide any history at this time. In the ED pt had another generalized seizure activity. Pt's dilantin level was very low consistent with pt not taking it.  S/p reload.  CTH no acute intracranial abnormality.  S/p dilantin load, now back  to baseline was not compliant with dilantin, but could not give me clear reason for non compliance.   - On dilantin 300 daily - d/c planning - neurology out pt follow up.  - Emphasis placed on compliance with dilantin.     07/02/2014, 12:10 PM

## 2014-07-02 NOTE — Progress Notes (Signed)
   07/02/14 1420  Clinical Encounter Type  Visited With Patient  Visit Type Initial  Pastoral support to patient.  Patient said she was "doing all right".  Patient appeared to be very quiet, did not speak to me very much.  I was uncertain if she understood everything I was saying to her.  Patient did thank me for coming to visit her.  Chaplain Teagyn Fishel-[phone]

## 2014-07-02 NOTE — Plan of Care (Signed)
Problem: Discharge Progression Outcomes Goal: Other Discharge Outcomes/Goals Outcome: Progressing Plan of Care Progressing to Goal: Barriers- Pt is lethargic, able to answer some questions but continues to dose off during questions Discharge plan- PT evaluation ordered, care management may need to be consulted to assist with discharge. Pain- pt denies pain this shift but states that upper lip is sore. Vital signs stable- VSS, off unit telemetry reports NSR, HR 70-80's. Hemodynamically stable- Completed all potassium bolus that were ordered upon arrival to room 112. IVF infusing per MD order.  Complications- per MD H&P pt is non compliant with seizure medication and will need to be educated and support given to comply with taking medication as prescribed. Pt has not been alert enough for PO intake.  Diet- Pt is NPO, mouth care provided per policy. No c/o nausea or vomiting this shift. Activity- Pt has been a total care pt this shift. Appears that pt was independent at home but unsure how pt ambulates or completes ADL's Continues on 3L oxygen.

## 2014-07-02 NOTE — Evaluation (Signed)
Physical Therapy Evaluation Patient Details Name: Sherry Boyd MRN: [id number] DOB: [birth year] Today's Date: 07/02/2014   History of Present Illness  Sherry Boyd is a 49 y.o. female with a known history of seizure disorder on Dilantin, chronic hypokalemia, hypertension was brought in by EMS secondary to seizure episode. Patient is postictal and unable to provide any history at this time. No family members or friends here at the hospital. The phone numbers of her family in the computer were tried, unable to reach them at this time. Pt noted to have K+:2.8 yesterday, improved to K+:3.3 today.   Clinical Impression  Pt tolerating evaluation moderately well, motivated and able to complete most of PT sesssion as planned, but ending short due to emergent need for patient hygiene. Pt demonstrating some questionable cognitive deficits, difficulty attending to task and/or line of questioning, as well as difficulty with recall of long-term memory. Inability to follow single step commands consistently resultant is difficulty thoroughly assessing strength and balance. Patient presenting with impairment of strength, cognition, incontinence, balance, and activity tolerance, limiting ability to perform ADL and mobility tasks at baseline level of function. Patient will benefit from skilled intervention to address the above impairments and limitations, in order to restore to prior level of function, improve patient safety upon discharge, and to decrease caregiver burden.      Follow Up Recommendations Home health PT;Supervision/Assistance - 24 hour    Equipment Recommendations  None recommended by PT    Recommendations for Other Services       Precautions / Restrictions Precautions Precautions: Fall Precaution Comments: Pt with history of epilepsy and recent even PTA.  Restrictions Weight Bearing Restrictions: No      Mobility  Bed Mobility Overal bed mobility: Needs Assistance Bed Mobility:  Supine to Sit     Supine to sit: Supervision     General bed mobility comments: Pt reports feeling 'much weaker' than usual.   Transfers Overall transfer level: Needs assistance Equipment used: None Transfers: Sit to/from Stand Sit to Stand: Supervision         General transfer comment: Visably weak, requiring mod effort to come to standing.   Ambulation/Gait Ambulation/Gait assistance: Supervision Ambulation Distance (Feet): 10 Feet Assistive device: None Gait Pattern/deviations:  (unable to get sufficient data at this time. )   Gait velocity interpretation: <1.8 ft/sec, indicative of risk for recurrent falls General Gait Details: Attempted to ambulated to BR at patient request, however pt unable to remain continent and voided on floor. Pt amb with mild ataxia, and session ended early to allow pt to get cleaned.   Stairs            Wheelchair Mobility    Modified Rankin (Stroke Patients Only)       Balance Overall balance assessment: Needs assistance Sitting-balance support: No upper extremity supported Sitting balance-Leahy Scale: Good Sitting balance - Comments: Confusion following commands for seated balance testing.    Standing balance support: No upper extremity supported Standing balance-Leahy Scale: Good                   Standardized Balance Assessment Standardized Balance Assessment :  (unable to formally assess at this time. )           Pertinent Vitals/Pain Pain Assessment: Faces Faces Pain Scale: Hurts a little bit Pain Location: Face, Legs Pain Descriptors / Indicators: Sore Pain Intervention(s): Monitored during session    Home Living Family/patient expects to be discharged to:: Private residence Living Arrangements: Children (  Son who is "twenty somthing" ) Available Help at Discharge: Family Type of Home: House Home Access: Stairs to enter Entrance Stairs-Rails:  (Unknown at this time. ) Entrance Stairs-Number of Steps:  Pt asked multiple times about number of steps, often confused, ultimately unable to estimate number of stairs.  Home Layout: One level Home Equipment: None Additional Comments: Pt is relatively lethargic, suspected due to medications but unable to verify. Hoping to get additional information from family.     Prior Function Level of Independence: Independent         Comments: Works part-time as a Engineer, structural.      Hand Dominance        Extremity/Trunk Assessment   Upper Extremity Assessment: Difficult to assess due to impaired cognition;Generalized weakness           Lower Extremity Assessment: Difficult to assess due to impaired cognition;Generalized weakness      Cervical / Trunk Assessment: Normal  Communication      Cognition Arousal/Alertness: Lethargic;Awake/alert Behavior During Therapy: Restless Overall Cognitive Status: Difficult to assess (Difficulty attending to task, seemingly somewhat lethartic. Confusion with following commands.)                      General Comments      Exercises        Assessment/Plan    PT Assessment Patient needs continued PT services  PT Diagnosis Difficulty walking;Abnormality of gait;Generalized weakness;Altered mental status   PT Problem List Decreased strength;Decreased cognition;Decreased safety awareness;Decreased balance;Decreased coordination;Pain  PT Treatment Interventions Gait training;Balance training;Stair training;Functional mobility training;Therapeutic activities;Therapeutic exercise;Patient/family education   PT Goals (Current goals can be found in the Care Plan section) Acute Rehab PT Goals Patient Stated Goal: Pt expresses want to feel less lethargic and less weak, returning back to normal.  PT Goal Formulation: With patient Time For Goal Achievement: 07/16/14 Potential to Achieve Goals: Fair    Frequency Min 2X/week   Barriers to discharge        Co-evaluation               End of  Session Equipment Utilized During Treatment: Gait belt Activity Tolerance: Patient limited by lethargy Patient left: in bed;with nursing/sitter in room           Time: 1130-1146 PT Time Calculation (min) (ACUTE ONLY): 16 min   Charges:   PT Evaluation $Initial PT Evaluation Tier I: 1 Procedure     PT G Codes:        Buccola,Allan C 07/08/2014, 1:14 PM  1:18 PM  Rosamaria Lints, PT, DPT [place] License # [id number]

## 2014-07-02 NOTE — Progress Notes (Signed)
Initial Nutrition Assessment  INTERVENTION: Meals and Snacks: Cater to patient preferences as diet order just advanced Medical Food Supplement Therapy: will recommend on follow if po intake poor    NUTRITION DIAGNOSIS:  Inadequate oral intake related to inability to eat as evidenced by NPO status.  GOAL:  Patient will meet greater than or equal to 90% of their needs  MONITOR:   (Energy Intake, Digestive system, Anthropometrics, Electrolyte and Renal Profile)  REASON FOR ASSESSMENT:  Malnutrition Screening Tool    ASSESSMENT:  Pt admitted s/p seizures; per MD note likely not taking dilantin PTA PMHx:  Past Medical History  Diagnosis Date  . Hypertension   . Seizures   . Hyperlipidemia    Current Nutrition: pt NPO since admission, diet just advanced  Nutrition Prior to Admission: pt reports good appetite PTA usually eating couple meals a day with snacks.  Medications: dilantin Labs:  Recent Labs Lab 07/01/14 1207 07/02/14 0527  NA 138 142  K 2.6* 3.3*  CL 105 109  CO2 25 25  BUN 12 6  CREATININE 0.89 0.73  CALCIUM 8.0* 7.9*  MG 1.5*  --   GLUCOSE 96 78   Anthropometrics: pt reports weight loss recently thinking UBW of 160-170lbs. (4-9% weight loss from UBW)  Height:  Ht Readings from Last 1 Encounters:  07/01/14 5\' 2"  (1.575 m)    Weight:  Wt Readings from Last 1 Encounters:  07/01/14 154 lb 14.4 oz (70.262 kg)     Wt Readings from Last 10 Encounters:  07/01/14 154 lb 14.4 oz (70.262 kg)    BMI:  Body mass index is 28.32 kg/(m^2).    Unable to complete Nutrition-Focused physical exam at this time.    Diet Order:  Diet regular Room service appropriate?: Yes; Fluid consistency:: Thin  EDUCATION NEEDS:  Education needs no appropriate at this time   Intake/Output Summary (Last 24 hours) at 07/02/14 1405 Last data filed at 07/02/14 1220  Gross per 24 hour  Intake    625 ml  Output      0 ml  Net    625 ml    Last BM:   unknown  LOW Care Level  Leda Quail, RD, LDN Pager [phone]

## 2014-07-02 NOTE — Discharge Summary (Addendum)
[facility]   PATIENT NAME: Sherry Boyd    MR#:  [id number]  DATE OF BIRTH:  [birth year]  DATE OF ADMISSION:  07/01/2014 ADMITTING PHYSICIAN: Enid Baas, MD  DATE OF DISCHARGE: 07/03/2014  PRIMARY CARE PHYSICIAN: [facility]    ADMISSION DIAGNOSIS:  Seizure [R56.9] Laceration [T14.8] Post-ictal state [R56.9]  DISCHARGE DIAGNOSIS:  Generalized Tonic-Clonic seizures (known h/o of seizures) Upper lip laceration s/p sutures HTN  SECONDARY DIAGNOSIS:   Past Medical History  Diagnosis Date  . Hypertension   . Seizures   . Hyperlipidemia     HOSPITAL COURSE:   49 y.o. female with a known history of seizure disorder on Dilantin,chronic hypokalemia, hypertension was brought in by EMS secondary to seizure episode.After she came back from her CT she was noticed to have a Tonic-clonic seizure for almost 30 seconds and has received some Ativan  #1 seizure- recureent. -Dilantin level is extremely low, not sure if she was not taking the medicine. -Loaded with Dilantin here in the emergency room. -Continue her Dilantin dose in the hospital. -Neurology has been consulted.recs noted -CT of the head did not show any acute changes. 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 -Follow and seizure precautions. - No more seizures inhouse -serum dilantin therapuetic  #2 hypokalemia-likely known history of hypokalemia, on chronic potassium supplements. --levels better  #3 hypertension-continue Norvasc  #4 depression-continue Prozac.  # 5 DVT prophylaxis-subcutaneous heparin.  -overall improved. Left message for son to call me. Will d/c home later today Pt advised nNOT to drive till she is cleared by her PCP or Neurlogy -HHPT arranged per PT recommendation  DISCHARGE CONDITIONS:   fair  CONSULTS OBTAINED:  Treatment Team:  Pauletta Browns, MD  DRUG ALLERGIES:  No Known Allergies  DISCHARGE  MEDICATIONS:   Current Discharge Medication List    CONTINUE these medications which have NOT CHANGED   Details  amLODipine (NORVASC) 5 MG tablet Take 5 mg by mouth daily.    FLUoxetine (PROZAC) 10 MG capsule Take 10 mg by mouth daily.    phenytoin (DILANTIN) 100 MG ER capsule Take 300 mg by mouth at bedtime.    potassium chloride (K-DUR,KLOR-CON) 10 MEQ tablet Take 10 mEq by mouth daily.         DISCHARGE INSTRUCTIONS:  DO not drive for atleast 6 months  If you experience worsening of your admission symptoms, develop shortness of breath, life threatening emergency, suicidal or homicidal thoughts you must seek medical attention immediately by calling 911 or calling your MD immediately  if symptoms less severe.  You Must read complete instructions/literature along with all the possible adverse reactions/side effects for all the Medicines you take and that have been prescribed to you. Take any new Medicines after you have completely understood and accept all the possible adverse reactions/side effects.   Please note  You were cared for by a hospitalist during your hospital stay. If you have any questions about your discharge medications or the care you received while you were in the hospital after you are discharged, you can call the unit and asked to speak with the hospitalist on call if the hospitalist that took care of you is not available. Once you are discharged, your primary care physician will handle any further medical issues. Please note that NO REFILLS for any discharge medications will be authorized once you are discharged, as it is imperative that you return to your primary care physician (or establish a relationship with  a primary care physician if you do not have one) for your aftercare needs so that they can reassess your need for medications and monitor your lab values. Today   SUBJECTIVE   More alert and awake       VITAL SIGNS:  Blood pressure 131/70, pulse 69,  temperature 99.5 F (37.5 C), temperature source Oral, resp. rate 16, height  (1.575 m), weight 70.262 kg (154 lb 14.4 oz), SpO2 99 %.  I/O:   Intake/Output Summary (Last 24 hours) at 07/02/14 1204 Last data filed at 07/02/14 0609  Gross per 24 hour  Intake    625 ml  Output      0 ml  Net    625 ml    PHYSICAL EXAMINATION:  GENERAL:  49 y.o.-year-old patient lying in the bed with no acute distress.  EYES: Pupils equal, round, reactive to light and accommodation. No scleral icterus. Extraocular muscles intact.  HEENT: Head atraumatic, normocephalic. Oropharynx and nasopharynx clear. Upper lip laceration+ sutured. Old blood  NECK:  Supple, no jugular venous distention. No thyroid enlargement, no tenderness.  LUNGS: Normal breath sounds bilaterally, no wheezing, rales,rhonchi or crepitation. No use of accessory muscles of respiration.  CARDIOVASCULAR: S1, S2 normal. No murmurs, rubs, or gallops.  ABDOMEN: Soft, non-tender, non-distended. Bowel sounds present. No organomegaly or mass.  EXTREMITIES: No pedal edema, cyanosis, or clubbing.  NEUROLOGIC: Cranial nerves II through XII are intact. Muscle strength 5/5 in all extremities. Sensation intact. Gait not checked.  PSYCHIATRIC: The patient is alert and oriented x 3.  SKIN: No obvious rash, lesion, or ulcer.   DATA REVIEW:   CBC   Recent Labs Lab 07/02/14 0527  WBC 9.0  HGB 12.0  HCT 35.6  PLT 139*    Chemistries   Recent Labs Lab 07/01/14 1207 07/02/14 0527  NA 138 142  K 2.6* 3.3*  CL 105 109  CO2 25 25  GLUCOSE 96 78  BUN 12 6  CREATININE 0.89 0.73  CALCIUM 8.0* 7.9*  MG 1.5*  --     Microbiology Results   No results found for this or any previous visit (from the past 240 hour(s)).  RADIOLOGY:  Dg Chest 1 View  07/01/2014   CLINICAL DATA:  Seizures.  Unresponsive patient.  Incontinence.  EXAM: CHEST  1 VIEW  COMPARISON:  07/11/2009  FINDINGS: Low lung volumes are present, causing crowding of the  pulmonary vasculature. Prominence of upper zone pulmonary vasculature noted with mild airway thickening. No airspace opacity observed. Heart size within normal limits. No blunting of the costophrenic angles.  IMPRESSION: 1. Airway thickening is present, suggesting bronchitis or reactive airways disease. 2. Upper zone pulmonary vascular prominence -pulmonary venous hypertension not excluded, but there is no overt cardiomegaly.   Electronically Signed   By: Gaylyn Rong M.D.   On: 07/01/2014 14:01   Ct Head Wo Contrast  07/01/2014   CLINICAL DATA:  Patient found unconscious with face and lips laceration. Lethargy and neck pain. History of seizure disorder.  EXAM: CT HEAD WITHOUT CONTRAST  CT MAXILLOFACIAL WITHOUT CONTRAST  CT CERVICAL SPINE WITHOUT CONTRAST  TECHNIQUE: Multidetector CT imaging of the head, cervical spine, and maxillofacial structures were performed using the standard protocol without intravenous contrast. Multiplanar CT image reconstructions of the cervical spine and maxillofacial structures were also generated.  COMPARISON:  Head CT 11/23/2012  FINDINGS: CT HEAD FINDINGS  Skull and Sinuses:Negative for fracture or destructive process. No hyperostosis.  Orbits: No acute abnormality.  Brain: No  evidence of acute infarction, hemorrhage, hydrocephalus, or mass lesion/mass effect. 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 No cortical findings to explain the seizure focus.  CT MAXILLOFACIAL FINDINGS  Soft tissue swelling about the mouth without underlying fracture. The mandible is intact and located. No evidence of globe injury or postseptal hematoma.  No incidental sinusitis.  Multiple devitalized teeth with periapical erosions around the left upper premolars, right lower terminal molar, and left lower second molar.  CT CERVICAL SPINE FINDINGS  Negative for acute fracture or subluxation. No prevertebral edema. No gross cervical canal hematoma.  Mild dorsal  endplate ridging at C5-6 and C6-7. No significant osseous canal or foraminal stenosis.  Symmetric enlargement and multi nodularity of the thyroid gland. The largest nodule visualized measures 12 mm. No gross adenopathy in the neck.  IMPRESSION: 1. No acute intracranial findings. No evidence of facial or cervical spine fracture. 2. 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 3. Multi nodular goiter.   Electronically Signed   By: Marnee Spring M.D.   On: 07/01/2014 13:10   Ct Cervical Spine Wo Contrast  07/01/2014   CLINICAL DATA:  Patient found unconscious with face and lips laceration. Lethargy and neck pain. History of seizure disorder.  EXAM: CT HEAD WITHOUT CONTRAST  CT MAXILLOFACIAL WITHOUT CONTRAST  CT CERVICAL SPINE WITHOUT CONTRAST  TECHNIQUE: Multidetector CT imaging of the head, cervical spine, and maxillofacial structures were performed using the standard protocol without intravenous contrast. Multiplanar CT image reconstructions of the cervical spine and maxillofacial structures were also generated.  COMPARISON:  Head CT 11/23/2012  FINDINGS: CT HEAD FINDINGS  Skull and Sinuses:Negative for fracture or destructive process. No hyperostosis.  Orbits: No acute abnormality.  Brain: No evidence of acute infarction, hemorrhage, hydrocephalus, or mass lesion/mass effect. 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 No cortical findings to explain the seizure focus.  CT MAXILLOFACIAL FINDINGS  Soft tissue swelling about the mouth without underlying fracture. The mandible is intact and located. No evidence of globe injury or postseptal hematoma.  No incidental sinusitis.  Multiple devitalized teeth with periapical erosions around the left upper premolars, right lower terminal molar, and left lower second molar.  CT CERVICAL SPINE FINDINGS  Negative for acute fracture or subluxation. No prevertebral edema. No gross cervical canal hematoma.  Mild dorsal  endplate ridging at C5-6 and C6-7. No significant osseous canal or foraminal stenosis.  Symmetric enlargement and multi nodularity of the thyroid gland. The largest nodule visualized measures 12 mm. No gross adenopathy in the neck.  IMPRESSION: 1. No acute intracranial findings. No evidence of facial or cervical spine fracture. 2. 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 3. Multi nodular goiter.   Electronically Signed   By: Marnee Spring M.D.   On: 07/01/2014 13:10   Ct Maxillofacial Wo Cm  07/01/2014   CLINICAL DATA:  Patient found unconscious with face and lips laceration. Lethargy and neck pain. History of seizure disorder.  EXAM: CT HEAD WITHOUT CONTRAST  CT MAXILLOFACIAL WITHOUT CONTRAST  CT CERVICAL SPINE WITHOUT CONTRAST  TECHNIQUE: Multidetector CT imaging of the head, cervical spine, and maxillofacial structures were performed using the standard protocol without intravenous contrast. Multiplanar CT image reconstructions of the cervical spine and maxillofacial structures were also generated.  COMPARISON:  Head CT 11/23/2012  FINDINGS: CT HEAD FINDINGS  Skull and Sinuses:Negative for fracture or destructive process. No hyperostosis.  Orbits: No acute abnormality.  Brain: No evidence of acute infarction, hemorrhage, hydrocephalus, or  mass lesion/mass effect. 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 No cortical findings to explain the seizure focus.  CT MAXILLOFACIAL FINDINGS  Soft tissue swelling about the mouth without underlying fracture. The mandible is intact and located. No evidence of globe injury or postseptal hematoma.  No incidental sinusitis.  Multiple devitalized teeth with periapical erosions around the left upper premolars, right lower terminal molar, and left lower second molar.  CT CERVICAL SPINE FINDINGS  Negative for acute fracture or subluxation. No prevertebral edema. No gross cervical canal hematoma.  Mild dorsal endplate  ridging at C5-6 and C6-7. No significant osseous canal or foraminal stenosis.  Symmetric enlargement and multi nodularity of the thyroid gland. The largest nodule visualized measures 12 mm. No gross adenopathy in the neck.  IMPRESSION: 1. No acute intracranial findings. No evidence of facial or cervical spine fracture. 2. 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 3. Multi nodular goiter.   Electronically Signed   By: Marnee Spring M.D.   On: 07/01/2014 13:10   Management plans discussed with the patient and  in agreement. Left message for son.  CODE STATUS:     Code Status Orders        Start     Ordered   07/01/14 1610  Full code   Continuous     07/01/14 1609      TOTAL TIME TAKING CARE OF THIS PATIENT: 40 minutes.    Tomasina Keasling M.D on 07/03/2014   Between 7am to 6pm - Pager - [phone] After 6pm go to [email or web address] - password [email or web address] [facility]  [facility]  Office  [phone]  CC: Primary care physician [facility]

## 2014-07-02 NOTE — Clinical Social Work Note (Signed)
Clinical Social Work Assessment  Patient Details  Name: Sherry Boyd MRN: [id number] Date of Birth: 12-09-1965  Date of referral:  07/02/14               Reason for consult:  Transportation                Permission sought to share information with:    Permission granted to share information::  No  Name::        Agency::     Relationship::     Contact Information:     Housing/Transportation Living arrangements for the past 2 months:  Single Family Home Source of Information:  Patient Patient Interpreter Needed:  None Criminal Activity/Legal Involvement Pertinent to Current Situation/Hospitalization:  No - Comment as needed Significant Relationships:  Adult Children, Friend Lives with:  Self Do you feel safe going back to the place where you live?  No Need for family participation in patient care:  No (Coment)  Care giving concerns:  None reported   Facilities manager / plan:  Holiday representative met with pt to address consult. CSW introduced herself and explained role of social work. Pt's son does not have a driver's license so her friends take her to appts and work. CSW encouraged for pt to call her friends about possible discharge tomorrow. Pt stated she will do so.   PT is recommending home with home health at discharge, which RNCM is following for. CSW will continue to follow during this admission for possible transportation needs.   Employment status:  Part-Time Insurance information:  Medicare PT Recommendations:  Home with [place] / Referral to community resources:  Other (Comment Required) [employer])  Patient/Family's Response to care:  Pt was appreciative of CSW support.   Patient/Family's Understanding of and Emotional Response to Diagnosis, Current Treatment, and Prognosis:  Pt is pleased that she will discharge tomorrow.   Emotional Assessment Appearance:  Appears stated age Attitude/Demeanor/Rapport:  Other  (appriopriate) Affect (typically observed):  Accepting Orientation:  Oriented to Self, Oriented to Place, Oriented to  Time, Oriented to Situation Alcohol / Substance use:  Never Used Psych involvement (Current and /or in the community):  No (Comment)  Discharge Needs  Concerns to be addressed:  Other (Comment Required (transportation) Readmission within the last 30 days:  No Current discharge risk:  None Barriers to Discharge:  No Barriers Identified   Darden Dates, LCSW 07/02/2014, 2:56 PM

## 2014-07-02 NOTE — Care Management (Signed)
Admitted to [facility] with the diagnosis of seizure activity. Son, Serafina Royals lives with her [phone]). Goes to [employer]. Oxygen 3 liters per nasal cannula Potassium 3.3 this morning. Awake answering questions appropriately.

## 2014-07-03 DIAGNOSIS — G4089 Other seizures: Secondary | ICD-10-CM | POA: Diagnosis not present

## 2014-07-03 NOTE — Care Management (Signed)
Discharge to home today per Dr. Allena Katz. Spoke with Ms. Guyse at the bedside. States she has no home health preference. Telephone call to Clydie Braun with [facility], Discharge information given to Hilbert Corrigan, representative for [facility]. Gwenette Greet RN MSN Care Management [phone]

## 2014-07-03 NOTE — Clinical Social Work Note (Signed)
Clinical Child psychotherapist provided information on [employer]. CSW also provided a cab voucher if needed and gave it to the RN. CSW is signing off as no further needs identified.   Dede Query, MSW, LCSW Clinical Social Worker [phone]

## 2014-07-03 NOTE — Progress Notes (Signed)
[facility] [place] at [facility]   PATIENT NAME: Sherry Boyd    MR#:  [id number]  DATE OF BIRTH:  October 05, 1965  SUBJECTIVE:  Doing well. Feels week and sore all over  REVIEW OF SYSTEMS:   Review of Systems  Constitutional: Positive for malaise/fatigue. Negative for fever, chills and weight loss.  HENT: Negative for ear discharge, ear pain and nosebleeds.   Eyes: Negative for blurred vision, pain and discharge.  Respiratory: Negative for sputum production, shortness of breath, wheezing and stridor.   Cardiovascular: Negative for chest pain, palpitations, orthopnea and PND.  Gastrointestinal: Negative for nausea, vomiting, abdominal pain and diarrhea.  Genitourinary: Negative for urgency and frequency.  Musculoskeletal: Negative for back pain and joint pain.  Neurological: Positive for weakness. Negative for sensory change, speech change and focal weakness.  Psychiatric/Behavioral: Negative for depression. The patient is not nervous/anxious.   All other systems reviewed and are negative.  Tolerating Diet:yes  DRUG ALLERGIES:  No Known Allergies  VITALS:  Blood pressure 134/72, pulse 72, temperature 98.2 F (36.8 C), temperature source Oral, resp. rate 18, height  (1.575 m), weight 70.262 kg (154 lb 14.4 oz), SpO2 100 %.  PHYSICAL EXAMINATION:   Physical Exam  GENERAL:  49 y.o.-year-old patient lying in the bed with no acute distress.  EYES: Pupils equal, round, reactive to light and accommodation. No scleral icterus. Extraocular muscles intact.  HEENT: Head atraumatic, normocephalic. Oropharynx and nasopharynx clear. Upper lip laceration s/p suture NECK:  Supple, no jugular venous distention. No thyroid enlargement, no tenderness.  LUNGS: Normal breath sounds bilaterally, no wheezing, rales, rhonchi. No use of accessory muscles of respiration.  CARDIOVASCULAR: S1, S2 normal. No murmurs, rubs, or gallops.  ABDOMEN: Soft, nontender,  nondistended. Bowel sounds present. No organomegaly or mass.  EXTREMITIES: No cyanosis, clubbing or edema b/l.    NEUROLOGIC: Cranial nerves II through XII are intact. No focal Motor or sensory deficits b/l.   PSYCHIATRIC: The patient is alert and oriented x 3.slow to respsond but answers all questions appropriately  SKIN: No obvious rash, lesion, or ulcer.    LABORATORY PANEL:   CBC  Recent Labs Lab 07/02/14 0527  WBC 9.0  HGB 12.0  HCT 35.6  PLT 139*    Chemistries   Recent Labs Lab 07/01/14 1207 07/02/14 0527  NA 138 142  K 2.6* 3.3*  CL 105 109  CO2 25 25  GLUCOSE 96 78  BUN 12 6  CREATININE 0.89 0.73  CALCIUM 8.0* 7.9*  MG 1.5*  --     Cardiac Enzymes No results for input(s): TROPONINI in the last 168 hours.  RADIOLOGY:  Dg Chest 1 View  07/01/2014   CLINICAL DATA:  Seizures.  Unresponsive patient.  Incontinence.  EXAM: CHEST  1 VIEW  COMPARISON:  07/11/2009  FINDINGS: Low lung volumes are present, causing crowding of the pulmonary vasculature. Prominence of upper zone pulmonary vasculature noted with mild airway thickening. No airspace opacity observed. Heart size within normal limits. No blunting of the costophrenic angles.  IMPRESSION: 1. Airway thickening is present, suggesting bronchitis or reactive airways disease. 2. Upper zone pulmonary vascular prominence -pulmonary venous hypertension not excluded, but there is no overt cardiomegaly.   Electronically Signed   By: Gaylyn Rong M.D.   On: 07/01/2014 14:01   Ct Head Wo Contrast  07/01/2014   CLINICAL DATA:  Patient found unconscious with face and lips laceration. Lethargy and neck pain. History of seizure disorder.  EXAM: CT HEAD  WITHOUT CONTRAST  CT MAXILLOFACIAL WITHOUT CONTRAST  CT CERVICAL SPINE WITHOUT CONTRAST  TECHNIQUE: Multidetector CT imaging of the head, cervical spine, and maxillofacial structures were performed using the standard protocol without intravenous contrast. Multiplanar CT image  reconstructions of the cervical spine and maxillofacial structures were also generated.  COMPARISON:  Head CT 11/23/2012  FINDINGS: CT HEAD FINDINGS  Skull and Sinuses:Negative for fracture or destructive process. No hyperostosis.  Orbits: No acute abnormality.  Brain: No evidence of acute infarction, hemorrhage, hydrocephalus, or mass lesion/mass effect. 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 No cortical findings to explain the seizure focus.  CT MAXILLOFACIAL FINDINGS  Soft tissue swelling about the mouth without underlying fracture. The mandible is intact and located. No evidence of globe injury or postseptal hematoma.  No incidental sinusitis.  Multiple devitalized teeth with periapical erosions around the left upper premolars, right lower terminal molar, and left lower second molar.  CT CERVICAL SPINE FINDINGS  Negative for acute fracture or subluxation. No prevertebral edema. No gross cervical canal hematoma.  Mild dorsal endplate ridging at C5-6 and C6-7. No significant osseous canal or foraminal stenosis.  Symmetric enlargement and multi nodularity of the thyroid gland. The largest nodule visualized measures 12 mm. No gross adenopathy in the neck.  IMPRESSION: 1. No acute intracranial findings. No evidence of facial or cervical spine fracture. 2. 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 3. Multi nodular goiter.   Electronically Signed   By: Marnee Spring M.D.   On: 07/01/2014 13:10   Ct Cervical Spine Wo Contrast  07/01/2014   CLINICAL DATA:  Patient found unconscious with face and lips laceration. Lethargy and neck pain. History of seizure disorder.  EXAM: CT HEAD WITHOUT CONTRAST  CT MAXILLOFACIAL WITHOUT CONTRAST  CT CERVICAL SPINE WITHOUT CONTRAST  TECHNIQUE: Multidetector CT imaging of the head, cervical spine, and maxillofacial structures were performed using the standard protocol without intravenous contrast. Multiplanar CT image  reconstructions of the cervical spine and maxillofacial structures were also generated.  COMPARISON:  Head CT 11/23/2012  FINDINGS: CT HEAD FINDINGS  Skull and Sinuses:Negative for fracture or destructive process. No hyperostosis.  Orbits: No acute abnormality.  Brain: No evidence of acute infarction, hemorrhage, hydrocephalus, or mass lesion/mass effect. 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 No cortical findings to explain the seizure focus.  CT MAXILLOFACIAL FINDINGS  Soft tissue swelling about the mouth without underlying fracture. The mandible is intact and located. No evidence of globe injury or postseptal hematoma.  No incidental sinusitis.  Multiple devitalized teeth with periapical erosions around the left upper premolars, right lower terminal molar, and left lower second molar.  CT CERVICAL SPINE FINDINGS  Negative for acute fracture or subluxation. No prevertebral edema. No gross cervical canal hematoma.  Mild dorsal endplate ridging at C5-6 and C6-7. No significant osseous canal or foraminal stenosis.  Symmetric enlargement and multi nodularity of the thyroid gland. The largest nodule visualized measures 12 mm. No gross adenopathy in the neck.  IMPRESSION: 1. No acute intracranial findings. No evidence of facial or cervical spine fracture. 2. 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 3. Multi nodular goiter.   Electronically Signed   By: Marnee Spring M.D.   On: 07/01/2014 13:10   Ct Maxillofacial Wo Cm  07/01/2014   CLINICAL DATA:  Patient found unconscious with face and lips laceration. Lethargy and neck pain. History of seizure disorder.  EXAM: CT HEAD WITHOUT CONTRAST  CT MAXILLOFACIAL WITHOUT CONTRAST  CT CERVICAL SPINE WITHOUT CONTRAST  TECHNIQUE: Multidetector CT imaging of the head, cervical spine, and maxillofacial structures were performed using the standard protocol without intravenous contrast. Multiplanar CT image  reconstructions of the cervical spine and maxillofacial structures were also generated.  COMPARISON:  Head CT 11/23/2012  FINDINGS: CT HEAD FINDINGS  Skull and Sinuses:Negative for fracture or destructive process. No hyperostosis.  Orbits: No acute abnormality.  Brain: No evidence of acute infarction, hemorrhage, hydrocephalus, or mass lesion/mass effect. 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 No cortical findings to explain the seizure focus.  CT MAXILLOFACIAL FINDINGS  Soft tissue swelling about the mouth without underlying fracture. The mandible is intact and located. No evidence of globe injury or postseptal hematoma.  No incidental sinusitis.  Multiple devitalized teeth with periapical erosions around the left upper premolars, right lower terminal molar, and left lower second molar.  CT CERVICAL SPINE FINDINGS  Negative for acute fracture or subluxation. No prevertebral edema. No gross cervical canal hematoma.  Mild dorsal endplate ridging at C5-6 and C6-7. No significant osseous canal or foraminal stenosis.  Symmetric enlargement and multi nodularity of the thyroid gland. The largest nodule visualized measures 12 mm. No gross adenopathy in the neck.  IMPRESSION: 1. No acute intracranial findings. No evidence of facial or cervical spine fracture. 2. 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 3. Multi nodular goiter.   Electronically Signed   By: Marnee Spring M.D.   On: 07/01/2014 13:10     ASSESSMENT AND PLAN:   49 y.o. female with a known history of seizure disorder on Dilantin,chronic hypokalemia, hypertension was brought in by EMS secondary to seizure episode.After she came back from her CT she was noticed to have a Tonic-clonic seizure for almost 30 seconds and has received some Ativan  #1 seizure- recureent. -Dilantin level is extremely low, not sure if she was not taking the medicine. -Loaded with Dilantin here in the emergency  room. -Continue her Dilantin dose in the hospital. -Neurology has been consulted.recs noted -CT of the head did not show any acute changes. 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 - No more seizures inhouse -serum dilantin therapuetic  #2 hypokalemia-likely known history of hypokalemia, on chronic potassium supplements. --levels better  #3 hypertension-continue Norvasc  #4 depression-continue Prozac.  # 5 DVT prophylaxis-subcutaneous heparin.   Case discussed with Care Management/Social Worker. Management plans discussed with the patient, family and they are in agreement.  CODE STATUS: full  TOTAL TIME TAKING CARE OF THIS PATIENT: 30  minutes.  >50% time spent on counselling and coordination of care  POSSIBLE D/C IN am DAYS, DEPENDING ON CLINICAL CONDITION.   Jalena Vanderlinden M.D on 07/02/2014 at 11:09 AM  Between 7am to 6pm - Pager - [phone]  After 6pm go to [email or web address] - password [email or web address]  [facility]  Office  [phone]  CC: Primary care physician; Pcp Not In System

## 2014-07-03 NOTE — Discharge Instructions (Signed)
TAKE all your meds including seizure meds without fail

## 2014-07-03 NOTE — Plan of Care (Signed)
Problem: Discharge Progression Outcomes Goal: Other Discharge Outcomes/Goals Outcome: Progressing Plan of care progress to goals: Pt a&o, slow purposeful speech. Flat affect. Denies pain/n/v. No seizure activity noted. Up to the chair with assistance. Colace and senna given at at bedtime, no BM during the night.

## 2014-08-07 ENCOUNTER — Ambulatory Visit: Payer: Self-pay | Admitting: Surgery

## 2015-12-18 ENCOUNTER — Other Ambulatory Visit: Payer: Self-pay | Admitting: Student

## 2015-12-18 DIAGNOSIS — B182 Chronic viral hepatitis C: Secondary | ICD-10-CM

## 2016-01-27 ENCOUNTER — Ambulatory Visit: Admission: RE | Admit: 2016-01-27 | Payer: Medicare Other | Source: Ambulatory Visit

## 2016-02-03 ENCOUNTER — Ambulatory Visit: Payer: Medicare Other

## 2016-03-25 ENCOUNTER — Other Ambulatory Visit: Payer: Self-pay | Admitting: Student

## 2016-03-25 DIAGNOSIS — B182 Chronic viral hepatitis C: Secondary | ICD-10-CM

## 2016-03-31 ENCOUNTER — Ambulatory Visit: Payer: Medicare Other

## 2016-04-06 ENCOUNTER — Ambulatory Visit: Admission: RE | Admit: 2016-04-06 | Payer: Medicare Other | Source: Ambulatory Visit

## 2016-04-06 ENCOUNTER — Ambulatory Visit: Payer: Medicare Other

## 2016-07-18 DEATH — deceased
